# Patient Record
Sex: Female | Born: 1960
Health system: Southern US, Community
[De-identification: ages and names within clinical notes are randomized; demographics above are authoritative.]

## PROBLEM LIST (undated history)

## (undated) DIAGNOSIS — B029 Zoster without complications: Secondary | ICD-10-CM

## (undated) DIAGNOSIS — K2270 Barrett's esophagus without dysplasia: Secondary | ICD-10-CM

## (undated) DIAGNOSIS — K209 Esophagitis, unspecified without bleeding: Secondary | ICD-10-CM

## (undated) DIAGNOSIS — U071 COVID-19: Secondary | ICD-10-CM

## (undated) DIAGNOSIS — K219 Gastro-esophageal reflux disease without esophagitis: Secondary | ICD-10-CM

## (undated) DIAGNOSIS — M1990 Unspecified osteoarthritis, unspecified site: Secondary | ICD-10-CM

## (undated) DIAGNOSIS — G5600 Carpal tunnel syndrome, unspecified upper limb: Secondary | ICD-10-CM

## (undated) DIAGNOSIS — E785 Hyperlipidemia, unspecified: Secondary | ICD-10-CM

## (undated) HISTORY — DX: Hyperlipidemia, unspecified: E78.5

## (undated) HISTORY — PX: VEIN SURGERY: SHX48

## (undated) HISTORY — DX: Esophagitis, unspecified without bleeding: K20.90

## (undated) HISTORY — PX: OTHER SURGICAL HISTORY: SHX169

## (undated) HISTORY — DX: Zoster without complications: B02.9

## (undated) HISTORY — DX: Barrett's esophagus without dysplasia: K22.70

## (undated) HISTORY — DX: Esophagitis, unspecified: K20.9

## (undated) HISTORY — DX: Unspecified osteoarthritis, unspecified site: M19.90

## (undated) HISTORY — DX: COVID-19: U07.1

## (undated) HISTORY — DX: Carpal tunnel syndrome, unspecified upper limb: G56.00

## (undated) HISTORY — PX: NOVASURE ABLATION: SHX5394

## (undated) HISTORY — DX: Gastro-esophageal reflux disease without esophagitis: K21.9

---

## 1998-11-24 HISTORY — PX: TUBAL LIGATION: SHX77

## 2009-02-09 ENCOUNTER — Ambulatory Visit: Payer: Self-pay | Admitting: General Practice

## 2010-03-12 ENCOUNTER — Other Ambulatory Visit: Payer: Self-pay | Admitting: Obstetrics and Gynecology

## 2010-04-23 ENCOUNTER — Ambulatory Visit: Payer: Self-pay | Admitting: Obstetrics and Gynecology

## 2010-07-31 ENCOUNTER — Other Ambulatory Visit: Payer: Self-pay

## 2011-03-14 ENCOUNTER — Other Ambulatory Visit: Payer: Self-pay | Admitting: Obstetrics and Gynecology

## 2011-05-01 ENCOUNTER — Ambulatory Visit: Payer: Self-pay | Admitting: Obstetrics and Gynecology

## 2011-08-02 ENCOUNTER — Other Ambulatory Visit: Payer: Self-pay

## 2011-08-20 ENCOUNTER — Ambulatory Visit: Payer: Self-pay | Admitting: Unknown Physician Specialty

## 2011-08-22 LAB — PATHOLOGY REPORT

## 2012-05-15 ENCOUNTER — Other Ambulatory Visit: Payer: Self-pay

## 2012-05-15 LAB — CBC WITH DIFFERENTIAL/PLATELET
Basophil #: 0 10*3/uL (ref 0.0–0.1)
Basophil %: 0.5 %
Eosinophil %: 1.5 %
HCT: 42.2 % (ref 35.0–47.0)
HGB: 14.4 g/dL (ref 12.0–16.0)
Lymphocyte #: 2 10*3/uL (ref 1.0–3.6)
Lymphocyte %: 28.5 %
MCH: 30.9 pg (ref 26.0–34.0)
Monocyte #: 0.4 x10 3/mm (ref 0.2–0.9)
Neutrophil #: 4.6 10*3/uL (ref 1.4–6.5)
Neutrophil %: 63.9 %
Platelet: 178 10*3/uL (ref 150–440)
RBC: 4.67 10*6/uL (ref 3.80–5.20)
RDW: 13.3 % (ref 11.5–14.5)
WBC: 7.2 10*3/uL (ref 3.6–11.0)

## 2012-05-15 LAB — TSH: Thyroid Stimulating Horm: 2.19 u[IU]/mL

## 2012-05-15 LAB — COMPREHENSIVE METABOLIC PANEL
BUN: 15 mg/dL (ref 7–18)
Bilirubin,Total: 0.4 mg/dL (ref 0.2–1.0)
Calcium, Total: 9 mg/dL (ref 8.5–10.1)
Co2: 25 mmol/L (ref 21–32)
Creatinine: 0.97 mg/dL (ref 0.60–1.30)
EGFR (African American): >60
Glucose: 89 mg/dL (ref 65–99)
Osmolality: 289 (ref 275–301)
Potassium: 4.1 mmol/L (ref 3.5–5.1)
SGOT(AST): 13 U/L — ABNORMAL LOW (ref 15–37)
SGPT (ALT): 24 U/L

## 2012-05-15 LAB — LIPID PANEL
HDL Cholesterol: 41 mg/dL (ref 40–60)
Triglycerides: 127 mg/dL (ref 0–200)
VLDL Cholesterol, Calc: 25 mg/dL (ref 5–40)

## 2012-06-01 ENCOUNTER — Ambulatory Visit: Payer: Self-pay | Admitting: Obstetrics and Gynecology

## 2013-07-25 HISTORY — PX: COLONOSCOPY: SHX174

## 2013-08-23 LAB — HM COLONOSCOPY

## 2014-01-05 ENCOUNTER — Other Ambulatory Visit: Payer: Self-pay | Admitting: Obstetrics and Gynecology

## 2014-01-05 LAB — COMPREHENSIVE METABOLIC PANEL
ALBUMIN: 3.9 g/dL (ref 3.4–5.0)
ANION GAP: 6 — AB (ref 7–16)
AST: 12 U/L — AB (ref 15–37)
Alkaline Phosphatase: 34 U/L — ABNORMAL LOW
BUN: 12 mg/dL (ref 7–18)
Bilirubin,Total: 0.5 mg/dL (ref 0.2–1.0)
CHLORIDE: 107 mmol/L (ref 98–107)
CREATININE: 0.86 mg/dL (ref 0.60–1.30)
Calcium, Total: 8.9 mg/dL (ref 8.5–10.1)
Co2: 25 mmol/L (ref 21–32)
Glucose: 98 mg/dL (ref 65–99)
Osmolality: 275 (ref 275–301)
Potassium: 3.9 mmol/L (ref 3.5–5.1)
SGPT (ALT): 25 U/L (ref 12–78)
SODIUM: 138 mmol/L (ref 136–145)
Total Protein: 7.3 g/dL (ref 6.4–8.2)

## 2014-01-05 LAB — CBC WITH DIFFERENTIAL/PLATELET
BASOS ABS: 0.1 10*3/uL (ref 0.0–0.1)
BASOS PCT: 1 %
EOS ABS: 0.1 10*3/uL (ref 0.0–0.7)
Eosinophil %: 2.1 %
HCT: 41 % (ref 35.0–47.0)
HGB: 13.8 g/dL (ref 12.0–16.0)
LYMPHS ABS: 1.9 10*3/uL (ref 1.0–3.6)
LYMPHS PCT: 33.9 %
MCH: 30.9 pg (ref 26.0–34.0)
MCHC: 33.7 g/dL (ref 32.0–36.0)
MCV: 92 fL (ref 80–100)
MONOS PCT: 7.4 %
Monocyte #: 0.4 x10 3/mm (ref 0.2–0.9)
NEUTROS PCT: 55.6 %
Neutrophil #: 3.1 10*3/uL (ref 1.4–6.5)
Platelet: 176 10*3/uL (ref 150–440)
RBC: 4.48 10*6/uL (ref 3.80–5.20)
RDW: 13.9 % (ref 11.5–14.5)
WBC: 5.5 10*3/uL (ref 3.6–11.0)

## 2014-01-05 LAB — LIPID PANEL
CHOLESTEROL: 159 mg/dL (ref 0–200)
HDL Cholesterol: 51 mg/dL (ref 40–60)
LDL CHOLESTEROL, CALC: 89 mg/dL (ref 0–100)
TRIGLYCERIDES: 94 mg/dL (ref 0–200)
VLDL CHOLESTEROL, CALC: 19 mg/dL (ref 5–40)

## 2014-01-05 LAB — TSH: Thyroid Stimulating Horm: 2.27 u[IU]/mL

## 2014-04-10 ENCOUNTER — Encounter: Payer: Self-pay | Admitting: *Deleted

## 2014-05-01 ENCOUNTER — Ambulatory Visit (INDEPENDENT_AMBULATORY_CARE_PROVIDER_SITE_OTHER): Payer: 59 | Admitting: General Surgery

## 2014-05-01 ENCOUNTER — Encounter: Payer: Self-pay | Admitting: General Surgery

## 2014-05-01 VITALS — BP 102/72 | HR 80 | Resp 14 | Ht 69.0 in | Wt 177.0 lb

## 2014-05-01 DIAGNOSIS — L723 Sebaceous cyst: Secondary | ICD-10-CM

## 2014-05-01 DIAGNOSIS — R229 Localized swelling, mass and lump, unspecified: Secondary | ICD-10-CM

## 2014-05-01 DIAGNOSIS — R222 Localized swelling, mass and lump, trunk: Secondary | ICD-10-CM

## 2014-05-01 NOTE — Patient Instructions (Signed)
1 week nurse  

## 2014-05-01 NOTE — Progress Notes (Signed)
Patient ID: Sarah Dennis, female   DOB: [birth year], 53 y.o.   MRN: [id number]  Chief Complaint  Patient presents with  . Other    cyst on back    HPI Sarah Dennis is a 53 y.o. female here today for an evaluation of an cyst on back. Patient states this cyst has been there for about two years. Patient states the area has been under the skin and in the last two months the area is raised. She states there was moles removed about 15 years ago in that same area.   HPI  Past Medical History  Diagnosis Date  . GERD (gastroesophageal reflux disease)   . Esophagitis     Past Surgical History  Procedure Laterality Date  . Tubal ligation  2000  . Vein surgery  2007,2012  . Cesarean section  1988  . Colonoscopy  07/2013    Dr. Mechele Collin  . Mole removal  15 yaers ago    No family history on file.  Social History History  Substance Use Topics  . Smoking status: Never Smoker   . Smokeless tobacco: Never Used  . Alcohol Use: Yes    Allergies  Allergen Reactions  . Percocet [Oxycodone-Acetaminophen] Itching    Current Outpatient Prescriptions  Medication Sig Dispense Refill  . aspirin 81 MG tablet Take 81 mg by mouth daily.      Marland Kitchen doxylamine, Sleep, (UNISOM) 25 MG tablet Take 25 mg by mouth at bedtime.      . Fish Oil-Cholecalciferol (FISH OIL + D3) 1000-1000 MG-UNIT CAPS Take 1 capsule by mouth daily.      Marland Kitchen glucosamine-chondroitin 500-400 MG tablet Take 1 tablet by mouth 3 (three) times daily.      . metoCLOPramide (REGLAN) 10 MG tablet Take 10 mg by mouth every 6 (six) hours as needed for nausea.      . Multiple Vitamins-Minerals (MULTIVITAMIN WITH MINERALS) tablet Take 1 tablet by mouth daily.      . norethindrone-ethinyl estradiol (MICROGESTIN,JUNEL,LOESTRIN) 1-20 MG-MCG tablet Take 1 tablet by mouth daily.      . pantoprazole (PROTONIX) 40 MG tablet Take 40 mg by mouth daily.       No current facility-administered medications for this visit.    Review of Systems Review of  Systems  Constitutional: Negative.   Respiratory: Negative.   Cardiovascular: Negative.     Blood pressure 102/72, pulse 80, resp. rate 14, height 5\' 9"  (1.753 m), weight 177 lb (80.287 kg).  Physical Exam Physical Exam  Constitutional: She is oriented to person, place, and time. She appears well-developed and well-nourished.  Eyes: Conjunctivae are normal. No scleral icterus.  Neck: Neck supple.  Cardiovascular: Normal rate, regular rhythm and normal heart sounds.   Pulmonary/Chest: Effort normal and breath sounds normal.  Neurological: She is alert and oriented to person, place, and time.  Skin: Skin is warm and dry.             Assessment    Sebaceous cyst of the right back and midline skin.     Plan    The areas of focal ureteric from her undergarments. It was elected to resect these lesions. A total of 20 cc of 0.5% Xylocaine with 0.25% Marcaine with 1-200,000 units of epinephrine was utilized to well-tolerated. The skin was prepped with ChloraPrep draped. A midline lesion was approached first. This was excised elliptical incision. The intact cyst was excised. Skin defect was closed with interrupted 4-0 nylon sutures. Telfa and Tegaderm dressing  applied.  Attention was turned to the right upper back lesion. This was prepped with ChloraPrep and excised elliptical incision. The cyst wall was removed in its entirety. The skin defect was closed with interrupted 4-0 nylon sutures. Telfa Tegaderm dressing applied.  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 As she is traveling to [facility] tomorrow to see her grandchild a prescription for Norco 5/325, #30 with description 1-2 p.o. Q.4 h. P.r.n. For pain was provided. She returned one week for suture removal to staff.        Merrily Pew Everton Bertha 05/01/2014, 9:05 PM

## 2014-05-02 LAB — PATHOLOGY

## 2014-05-03 ENCOUNTER — Telehealth: Payer: Self-pay | Admitting: *Deleted

## 2014-05-03 NOTE — Telephone Encounter (Signed)
Message copied by Carson Myrtle on Wed May 03, 2014  8:10 AM ------      Message from: Robert Bellow      Created: Tue May 02, 2014  7:51 PM       Please notify the patient both lesions were benign. She is traveling, so may be best route to notify her..      ----- Message -----         From: [facility] Lab Results In Interface         Sent: 05/02/2014   4:42 PM           To: Robert Bellow, MD                   ------

## 2014-05-03 NOTE — Telephone Encounter (Signed)
Notified patient as instructed, patient pleased. Discussed follow-up appointments, patient agrees  

## 2014-05-08 ENCOUNTER — Ambulatory Visit (INDEPENDENT_AMBULATORY_CARE_PROVIDER_SITE_OTHER): Payer: 59 | Admitting: *Deleted

## 2014-05-08 DIAGNOSIS — R222 Localized swelling, mass and lump, trunk: Secondary | ICD-10-CM

## 2014-05-08 DIAGNOSIS — R229 Localized swelling, mass and lump, unspecified: Secondary | ICD-10-CM

## 2014-05-08 NOTE — Progress Notes (Signed)
Patient came in today for a wound check back cyst excision .  The wound is clean, with no signs of infection noted. The sutures were removed and steri strips applied.

## 2014-09-25 ENCOUNTER — Encounter: Payer: Self-pay | Admitting: General Surgery

## 2015-02-15 ENCOUNTER — Encounter: Payer: Self-pay | Admitting: General Surgery

## 2015-02-15 ENCOUNTER — Ambulatory Visit (INDEPENDENT_AMBULATORY_CARE_PROVIDER_SITE_OTHER): Payer: 59 | Admitting: General Surgery

## 2015-02-15 VITALS — BP 110/78 | HR 80 | Resp 12 | Ht 69.0 in | Wt 182.0 lb

## 2015-02-15 DIAGNOSIS — L989 Disorder of the skin and subcutaneous tissue, unspecified: Secondary | ICD-10-CM

## 2015-02-15 DIAGNOSIS — R2241 Localized swelling, mass and lump, right lower limb: Secondary | ICD-10-CM

## 2015-02-15 NOTE — Progress Notes (Signed)
Patient ID: Sarah Dennis, female   DOB: 09-18-1961, 54 y.o.   MRN: [id number]  Chief Complaint  Patient presents with  . Procedure    HPI Sarah Dennis is a 54 y.o. female.  Here today for excision of a mass posterior neck and upper right thigh. Both areas are frequently traumatized by closing or jewelry. No new complaints.  HPI  Past Medical History  Diagnosis Date  . GERD (gastroesophageal reflux disease)   . Esophagitis     Past Surgical History  Procedure Laterality Date  . Tubal ligation  2000  . Vein surgery  2007,2012  . Cesarean section  1988  . Colonoscopy  07/2013    Dr. Vira Agar  . Mole removal  15 yaers ago    No family history on file.  Social History History  Substance Use Topics  . Smoking status: Never Smoker   . Smokeless tobacco: Never Used  . Alcohol Use: Yes    Allergies  Allergen Reactions  . Percocet [Oxycodone-Acetaminophen] Itching    Current Outpatient Prescriptions  Medication Sig Dispense Refill  . aspirin 81 MG tablet Take 81 mg by mouth daily.    Marland Kitchen doxylamine, Sleep, (UNISOM) 25 MG tablet Take 25 mg by mouth at bedtime.    . Fish Oil-Cholecalciferol (FISH OIL + D3) 1000-1000 MG-UNIT CAPS Take 1 capsule by mouth daily.    Marland Kitchen glucosamine-chondroitin 500-400 MG tablet Take 1 tablet by mouth 3 (three) times daily.    . metoCLOPramide (REGLAN) 10 MG tablet Take 10 mg by mouth every 6 (six) hours as needed for nausea.    . Multiple Vitamins-Minerals (MULTIVITAMIN WITH MINERALS) tablet Take 1 tablet by mouth daily.    . norethindrone-ethinyl estradiol (MICROGESTIN,JUNEL,LOESTRIN) 1-20 MG-MCG tablet Take 1 tablet by mouth daily.    . pantoprazole (PROTONIX) 40 MG tablet Take 40 mg by mouth daily.     No current facility-administered medications for this visit.    Review of Systems Review of Systems  Constitutional: Negative.   Respiratory: Negative.   Cardiovascular: Negative.     Blood pressure 110/78, pulse 80, resp. rate 12, height 5'  9" (1.753 m), weight 182 lb (82.555 kg).  Physical Exam Physical Exam  Neck:    Abdominal:         Assessment    Symptomatic skin nodules.    Plan    The area on the thigh was treated with 10 mL of 0.5% Xylocaine with 0.25% Marcaine with 1-200,000 of epinephrine. The neck lesion received 6 mL of a similar mixture.  Both sites were prepped with ChloraPrep and draped. Each were excised through elliptical incisions. Skin lesion on the was completed first. The skin defect was closed with interrupted 5-0 nylon sutures. The proximal thigh lesion on the right was treated in a similar fashion. Both wounds were covered with Telfa and Tegaderm dressings. Both lesions were sent for routine histology.  The patient already the procedure well. She'll make use of Tylenol if needed for pain. She will follow up in one week for suture removal and wound evaluation with the staff.     PCP:  No Pcp Per Patient  Sarah Dennis 02/16/2015, 6:30 AM

## 2015-02-15 NOTE — Patient Instructions (Signed)
Keep area clean 

## 2015-02-16 DIAGNOSIS — L989 Disorder of the skin and subcutaneous tissue, unspecified: Secondary | ICD-10-CM | POA: Insufficient documentation

## 2015-02-16 DIAGNOSIS — R2240 Localized swelling, mass and lump, unspecified lower limb: Secondary | ICD-10-CM

## 2015-02-16 HISTORY — DX: Localized swelling, mass and lump, unspecified lower limb: R22.40

## 2015-02-16 HISTORY — DX: Disorder of the skin and subcutaneous tissue, unspecified: L98.9

## 2015-02-22 ENCOUNTER — Ambulatory Visit (INDEPENDENT_AMBULATORY_CARE_PROVIDER_SITE_OTHER): Payer: 59 | Admitting: *Deleted

## 2015-02-22 ENCOUNTER — Ambulatory Visit: Payer: 59

## 2015-02-22 DIAGNOSIS — L723 Sebaceous cyst: Secondary | ICD-10-CM

## 2015-02-22 NOTE — Progress Notes (Signed)
Patient came in today for a wound check, suture removal.  The wound is clean, with no signs of infection noted. Follow up as needed.

## 2015-02-22 NOTE — Patient Instructions (Signed)
The patient is aware to call back for any questions or concerns.  

## 2015-07-26 ENCOUNTER — Other Ambulatory Visit
Admission: RE | Admit: 2015-07-26 | Discharge: 2015-07-26 | Disposition: A | Discharge status: Home / Self Care | Payer: 59 | Source: Ambulatory Visit | Attending: Obstetrics and Gynecology | Admitting: Obstetrics and Gynecology

## 2015-07-26 DIAGNOSIS — N951 Menopausal and female climacteric states: Secondary | ICD-10-CM | POA: Insufficient documentation

## 2015-07-26 LAB — COMPREHENSIVE METABOLIC PANEL
ALK PHOS: 38 U/L (ref 38–126)
ALT: 31 U/L (ref 14–54)
AST: 28 U/L (ref 15–41)
Albumin: 4.2 g/dL (ref 3.5–5.0)
Anion gap: 9 (ref 5–15)
BILIRUBIN TOTAL: 0.3 mg/dL (ref 0.3–1.2)
BUN: 14 mg/dL (ref 6–20)
CO2: 24 mmol/L (ref 22–32)
Calcium: 9 mg/dL (ref 8.9–10.3)
Chloride: 107 mmol/L (ref 101–111)
Creatinine, Ser: 0.81 mg/dL (ref 0.44–1.00)
GLUCOSE: 91 mg/dL (ref 65–99)
Potassium: 4 mmol/L (ref 3.5–5.1)
Sodium: 140 mmol/L (ref 135–145)
Total Protein: 7.3 g/dL (ref 6.5–8.1)

## 2015-07-26 LAB — LIPID PANEL
CHOL/HDL RATIO: 4 ratio
Cholesterol: 207 mg/dL — ABNORMAL HIGH (ref 0–200)
HDL: 52 mg/dL (ref >40)
LDL CALC: 139 mg/dL — AB (ref 0–99)
TRIGLYCERIDES: 78 mg/dL (ref <150)
VLDL: 16 mg/dL (ref 0–40)

## 2015-07-26 LAB — CBC WITH DIFFERENTIAL/PLATELET
Basophils Absolute: 0.1 10*3/uL (ref 0–0.1)
Basophils Relative: 1 %
Eosinophils Absolute: 0.2 10*3/uL (ref 0–0.7)
Eosinophils Relative: 3 %
HEMATOCRIT: 42.4 % (ref 35.0–47.0)
HEMOGLOBIN: 14.3 g/dL (ref 12.0–16.0)
LYMPHS ABS: 2.3 10*3/uL (ref 1.0–3.6)
Lymphocytes Relative: 38 %
MCH: 30.7 pg (ref 26.0–34.0)
MCHC: 33.8 g/dL (ref 32.0–36.0)
MCV: 90.7 fL (ref 80.0–100.0)
MONOS PCT: 8 %
Monocytes Absolute: 0.5 10*3/uL (ref 0.2–0.9)
NEUTROS ABS: 3.1 10*3/uL (ref 1.4–6.5)
NEUTROS PCT: 50 %
Platelets: 172 10*3/uL (ref 150–440)
RBC: 4.67 MIL/uL (ref 3.80–5.20)
RDW: 13.1 % (ref 11.5–14.5)
WBC: 6.2 10*3/uL (ref 3.6–11.0)

## 2015-07-26 LAB — TSH: TSH: 2.747 u[IU]/mL (ref 0.350–4.500)

## 2015-07-27 LAB — VITAMIN D 25 HYDROXY (VIT D DEFICIENCY, FRACTURES): VIT D 25 HYDROXY: 49.9 ng/mL (ref 30.0–100.0)

## 2015-07-27 LAB — FOLLICLE STIMULATING HORMONE: FSH: 30.2 m[IU]/mL

## 2015-07-31 LAB — MISC LABCORP TEST (SEND OUT): LABCORP TEST CODE: 500649

## 2015-08-23 ENCOUNTER — Other Ambulatory Visit
Admission: RE | Admit: 2015-08-23 | Discharge: 2015-08-23 | Disposition: A | Discharge status: Home / Self Care | Payer: 59 | Source: Ambulatory Visit | Attending: Obstetrics and Gynecology | Admitting: Obstetrics and Gynecology

## 2015-08-23 DIAGNOSIS — N951 Menopausal and female climacteric states: Secondary | ICD-10-CM | POA: Insufficient documentation

## 2015-08-24 LAB — FOLLICLE STIMULATING HORMONE: FSH: 52.6 m[IU]/mL

## 2015-08-30 LAB — MISC LABCORP TEST (SEND OUT): LABCORP TEST CODE: 500649

## 2015-12-13 DIAGNOSIS — G64 Other disorders of peripheral nervous system: Secondary | ICD-10-CM | POA: Diagnosis not present

## 2015-12-31 ENCOUNTER — Encounter: Payer: 59 | Admitting: Neurology

## 2016-01-08 ENCOUNTER — Encounter: Payer: Self-pay | Admitting: Neurology

## 2016-01-08 ENCOUNTER — Ambulatory Visit (INDEPENDENT_AMBULATORY_CARE_PROVIDER_SITE_OTHER): Payer: 59 | Admitting: Neurology

## 2016-01-08 ENCOUNTER — Ambulatory Visit (INDEPENDENT_AMBULATORY_CARE_PROVIDER_SITE_OTHER): Payer: Self-pay | Admitting: Neurology

## 2016-01-08 DIAGNOSIS — G5601 Carpal tunnel syndrome, right upper limb: Secondary | ICD-10-CM | POA: Diagnosis not present

## 2016-01-08 DIAGNOSIS — G5600 Carpal tunnel syndrome, unspecified upper limb: Secondary | ICD-10-CM

## 2016-01-08 HISTORY — DX: Carpal tunnel syndrome, unspecified upper limb: G56.00

## 2016-01-08 NOTE — Procedures (Signed)
     HISTORY:  Sarah Dennis is a 55 year old patient with a one-month history of some numbness involving the right hand, worse in the evening hours. She reports some mild weakness with grip. She denies any neck pain or pain down the right arm. There are no symptoms involving the left hand. She is being evaluated for possible carpal tunnel syndrome.  NERVE CONDUCTION STUDIES:  Nerve conduction studies were performed on the right upper extremity. The distal motor latencies and motor amplitudes for the ulnar nerves was within normal limits. The distal motor latency for the right median nerve was prolonged, with a normal motor amplitude. The F wave latencies and nerve conduction velocities for the median and ulnar nerves were normal. The sensory latencies for the ulnar nerve was normal, but was prolonged for the right median nerve.   EMG STUDIES:  EMG study was performed on the right upper extremity:  The first dorsal interosseous muscle reveals 2 to 4 K units with full recruitment. No fibrillations or positive waves were noted. The abductor pollicis brevis muscle reveals 2 to 4 K units with full recruitment. No fibrillations or positive waves were noted. The extensor indicis proprius muscle reveals 1 to 3 K units with full recruitment. No fibrillations or positive waves were noted. The pronator teres muscle reveals 2 to 3 K units with full recruitment. No fibrillations or positive waves were noted. The biceps muscle reveals 1 to 2 K units with full recruitment. No fibrillations or positive waves were noted. The triceps muscle reveals 2 to 4 K units with full recruitment. No fibrillations or positive waves were noted. The anterior deltoid muscle reveals 2 to 3 K units with full recruitment. No fibrillations or positive waves were noted. The cervical paraspinal muscles were tested at 2 levels. No abnormalities of insertional activity were seen at either level tested. There was good  relaxation.   IMPRESSION:  Nerve conduction studies done on the right upper extremity shows evidence of a mild right carpal tunnel syndrome. EMG evaluation of the right upper extremity was unremarkable, without evidence of an overlying cervical radiculopathy.  Jill Alexanders MD 01/08/2016 4:44 PM  [facility] [address] [place], [place] [address]  Phone [phone] Fax [phone]

## 2016-01-08 NOTE — Progress Notes (Signed)
Please refer to EMG and nerve conduction study procedure note. 

## 2016-01-25 ENCOUNTER — Encounter: Payer: Self-pay | Admitting: Neurology

## 2016-01-25 NOTE — Progress Notes (Signed)
Please refer to EMG and nerve conduction study procedure note. 

## 2016-01-25 NOTE — Addendum Note (Signed)
Addended by: Margette Fast on: 01/25/2016 07:47 AM   Modules accepted: Level of Service

## 2016-02-28 DIAGNOSIS — G5601 Carpal tunnel syndrome, right upper limb: Secondary | ICD-10-CM | POA: Diagnosis not present

## 2016-05-02 ENCOUNTER — Encounter: Payer: Self-pay | Admitting: Physician Assistant

## 2016-05-02 ENCOUNTER — Ambulatory Visit: Payer: Self-pay | Admitting: Physician Assistant

## 2016-05-02 VITALS — BP 112/80 | HR 64 | Temp 98.6°F

## 2016-05-02 DIAGNOSIS — H00026 Hordeolum internum left eye, unspecified eyelid: Secondary | ICD-10-CM

## 2016-05-02 MED ORDER — OLOPATADINE HCL 0.1 % OP SOLN: 1.0000 [drp] | Freq: Two times a day (BID) | OPHTHALMIC | Status: DC

## 2016-05-02 MED ORDER — GENTAMICIN SULFATE 0.3 % OP SOLN: 1.0000 [drp] | OPHTHALMIC | Status: DC

## 2016-05-02 NOTE — Progress Notes (Signed)
   Subjective:left eye pain    Patient ID: Sarah Dennis, female    DOB: February 24, 1961, 55 y.o.   MRN: [id number]  HPI Patient c/o left eye pain 2nd to papular lesion internal inner eye lid. Denies vision problems. Onset yesterdays. Patient applying warm compresses to area.  Review of Systems    Gastric Refluc Objective:   Physical Exam No acute distress. PERRL/EOM intact. Erythematous papular lesion left inner lower eyelid.       Assessment & Plan:Sty left eye  Continue warm compresses and instill eye drops as directed.  Follow up 3 days if no improvement.

## 2016-07-02 DIAGNOSIS — Z124 Encounter for screening for malignant neoplasm of cervix: Secondary | ICD-10-CM | POA: Diagnosis not present

## 2016-07-02 DIAGNOSIS — Z01419 Encounter for gynecological examination (general) (routine) without abnormal findings: Secondary | ICD-10-CM | POA: Diagnosis not present

## 2016-07-28 ENCOUNTER — Other Ambulatory Visit
Admission: RE | Admit: 2016-07-28 | Discharge: 2016-07-28 | Disposition: A | Discharge status: Home / Self Care | Payer: 59 | Source: Ambulatory Visit | Attending: Obstetrics and Gynecology | Admitting: Obstetrics and Gynecology

## 2016-07-28 DIAGNOSIS — Z1321 Encounter for screening for nutritional disorder: Secondary | ICD-10-CM | POA: Insufficient documentation

## 2016-07-28 DIAGNOSIS — Z13220 Encounter for screening for lipoid disorders: Secondary | ICD-10-CM | POA: Insufficient documentation

## 2016-07-28 DIAGNOSIS — Z131 Encounter for screening for diabetes mellitus: Secondary | ICD-10-CM | POA: Diagnosis not present

## 2016-07-28 DIAGNOSIS — Z136 Encounter for screening for cardiovascular disorders: Secondary | ICD-10-CM | POA: Insufficient documentation

## 2016-07-28 LAB — CBC WITH DIFFERENTIAL/PLATELET
BASOS ABS: 0 10*3/uL (ref 0–0.1)
BASOS PCT: 1 %
EOS ABS: 0.1 10*3/uL (ref 0–0.7)
Eosinophils Relative: 3 %
HCT: 41.6 % (ref 35.0–47.0)
HEMOGLOBIN: 14.6 g/dL (ref 12.0–16.0)
Lymphocytes Relative: 27 %
Lymphs Abs: 1.5 10*3/uL (ref 1.0–3.6)
MCH: 30.8 pg (ref 26.0–34.0)
MCHC: 35.1 g/dL (ref 32.0–36.0)
MCV: 87.8 fL (ref 80.0–100.0)
MONOS PCT: 8 %
Monocytes Absolute: 0.5 10*3/uL (ref 0.2–0.9)
NEUTROS ABS: 3.5 10*3/uL (ref 1.4–6.5)
NEUTROS PCT: 61 %
Platelets: 151 10*3/uL (ref 150–440)
RBC: 4.73 MIL/uL (ref 3.80–5.20)
RDW: 13.9 % (ref 11.5–14.5)
WBC: 5.7 10*3/uL (ref 3.6–11.0)

## 2016-07-28 LAB — LIPID PANEL
CHOL/HDL RATIO: 3.8 ratio
Cholesterol: 251 mg/dL — ABNORMAL HIGH (ref 0–200)
HDL: 66 mg/dL (ref >40)
LDL CALC: 174 mg/dL — AB (ref 0–99)
Triglycerides: 57 mg/dL (ref <150)
VLDL: 11 mg/dL (ref 0–40)

## 2016-07-28 LAB — COMPREHENSIVE METABOLIC PANEL
ALBUMIN: 4.6 g/dL (ref 3.5–5.0)
ALT: 18 U/L (ref 14–54)
AST: 16 U/L (ref 15–41)
Alkaline Phosphatase: 67 U/L (ref 38–126)
Anion gap: 9 (ref 5–15)
BUN: 28 mg/dL — AB (ref 6–20)
CHLORIDE: 106 mmol/L (ref 101–111)
CO2: 25 mmol/L (ref 22–32)
CREATININE: 0.82 mg/dL (ref 0.44–1.00)
Calcium: 9.3 mg/dL (ref 8.9–10.3)
GFR calc Af Amer: >60 mL/min (ref >60)
GFR calc non Af Amer: >60 mL/min (ref >60)
GLUCOSE: 108 mg/dL — AB (ref 65–99)
POTASSIUM: 4.2 mmol/L (ref 3.5–5.1)
Sodium: 140 mmol/L (ref 135–145)
Total Bilirubin: 0.7 mg/dL (ref 0.3–1.2)
Total Protein: 7.6 g/dL (ref 6.5–8.1)

## 2016-07-28 LAB — TSH: TSH: 2.199 u[IU]/mL (ref 0.350–4.500)

## 2016-07-29 LAB — VITAMIN D 25 HYDROXY (VIT D DEFICIENCY, FRACTURES): Vit D, 25-Hydroxy: 41.7 ng/mL (ref 30.0–100.0)

## 2016-08-07 DIAGNOSIS — Z1231 Encounter for screening mammogram for malignant neoplasm of breast: Secondary | ICD-10-CM | POA: Diagnosis not present

## 2016-10-07 DIAGNOSIS — H40003 Preglaucoma, unspecified, bilateral: Secondary | ICD-10-CM | POA: Diagnosis not present

## 2016-10-07 DIAGNOSIS — H5213 Myopia, bilateral: Secondary | ICD-10-CM | POA: Diagnosis not present

## 2016-10-29 ENCOUNTER — Telehealth: Payer: 59 | Admitting: Family

## 2016-10-29 DIAGNOSIS — J329 Chronic sinusitis, unspecified: Secondary | ICD-10-CM | POA: Diagnosis not present

## 2016-10-29 DIAGNOSIS — B9789 Other viral agents as the cause of diseases classified elsewhere: Secondary | ICD-10-CM | POA: Diagnosis not present

## 2016-10-29 MED ORDER — FLUTICASONE PROPIONATE 50 MCG/ACT NA SUSP: 2.0000 | Freq: Every day | NASAL | 6 refills | Status: DC

## 2016-10-29 NOTE — Progress Notes (Signed)

## 2016-12-03 ENCOUNTER — Telehealth: Payer: 59 | Admitting: Nurse Practitioner

## 2016-12-03 DIAGNOSIS — R05 Cough: Secondary | ICD-10-CM

## 2016-12-03 DIAGNOSIS — R059 Cough, unspecified: Secondary | ICD-10-CM

## 2016-12-03 MED ORDER — BENZONATATE 100 MG PO CAPS: 100.0000 mg | ORAL_CAPSULE | Freq: Three times a day (TID) | ORAL | 0 refills | Status: DC | PRN

## 2016-12-03 MED ORDER — AZITHROMYCIN 250 MG PO TABS: ORAL_TABLET | ORAL | 0 refills | Status: DC

## 2016-12-03 NOTE — Progress Notes (Signed)

## 2017-03-17 ENCOUNTER — Ambulatory Visit: Payer: Self-pay | Admitting: Physician Assistant

## 2017-03-17 ENCOUNTER — Encounter: Payer: Self-pay | Admitting: Physician Assistant

## 2017-03-17 VITALS — BP 120/90 | HR 90 | Temp 99.0°F

## 2017-03-17 DIAGNOSIS — B029 Zoster without complications: Secondary | ICD-10-CM

## 2017-03-17 MED ORDER — METHYLPREDNISOLONE 4 MG PO TBPK: ORAL_TABLET | ORAL | 0 refills | Status: DC

## 2017-03-17 MED ORDER — FAMCICLOVIR 500 MG PO TABS: 500.0000 mg | ORAL_TABLET | Freq: Three times a day (TID) | ORAL | 0 refills | Status: DC

## 2017-03-17 NOTE — Progress Notes (Signed)
S: c/o rash on scalp, painful, radiates to ear, hx of shingles 5 years ago and it went into her eye, no known fever/chills, no body aches, no there sx, ros neg  O: vitals wnl, nad, scalp with a few lesions in hair, no drainage, n/v intact, lungs c t a, cv rrr  A: shingles  P: famvir, medrol dose

## 2017-05-12 ENCOUNTER — Encounter: Payer: Self-pay | Admitting: General Surgery

## 2017-05-12 ENCOUNTER — Ambulatory Visit (INDEPENDENT_AMBULATORY_CARE_PROVIDER_SITE_OTHER): Payer: 59 | Admitting: General Surgery

## 2017-05-12 VITALS — BP 108/76 | HR 82 | Resp 12 | Ht 69.0 in | Wt 198.0 lb

## 2017-05-12 DIAGNOSIS — L738 Other specified follicular disorders: Secondary | ICD-10-CM | POA: Diagnosis not present

## 2017-05-12 DIAGNOSIS — L723 Sebaceous cyst: Secondary | ICD-10-CM | POA: Diagnosis not present

## 2017-05-12 DIAGNOSIS — L989 Disorder of the skin and subcutaneous tissue, unspecified: Secondary | ICD-10-CM

## 2017-05-12 NOTE — Patient Instructions (Addendum)
The patient is aware to call back for any questions or concerns. Keep area clean 

## 2017-05-12 NOTE — Progress Notes (Signed)
Patient ID: Sarah Dennis, female   DOB: [birth year], 56 y.o.   MRN: [id number]  Chief Complaint  Patient presents with  . Mass    HPI Sarah Dennis is a 56 y.o. female.  Here today for evaluation of 2 skin moles/tags. One is right side of face near eye and the other is right clavicle are. She states they have been there many years.   HPI  Past Medical History:  Diagnosis Date  . Carpal tunnel syndrome 01/08/2016   Right  . Esophagitis   . GERD (gastroesophageal reflux disease)     Past Surgical History:  Procedure Laterality Date  . CESAREAN SECTION  1988  . COLONOSCOPY  07/2013   Dr. Vira Agar  . mole removal  15 yaers ago  . TUBAL LIGATION  2000  . VEIN SURGERY  2007,2012    No family history on file.  Social History Social History  Substance Use Topics  . Smoking status: Never Smoker  . Smokeless tobacco: Never Used  . Alcohol use Yes    Allergies  Allergen Reactions  . Percocet [Oxycodone-Acetaminophen] Itching    Current Outpatient Prescriptions  Medication Sig Dispense Refill  . doxylamine, Sleep, (UNISOM) 25 MG tablet Take 25 mg by mouth at bedtime.    Marland Kitchen glucosamine-chondroitin 500-400 MG tablet Take 1 tablet by mouth 3 (three) times daily.    . metoCLOPramide (REGLAN) 10 MG tablet Take 10 mg by mouth every 6 (six) hours as needed for nausea.    . pantoprazole (PROTONIX) 40 MG tablet Take 40 mg by mouth daily.    . progesterone (PROMETRIUM) 200 MG capsule   3   No current facility-administered medications for this visit.     Review of Systems Review of Systems  Constitutional: Negative.   Respiratory: Negative.   Cardiovascular: Negative.     Blood pressure 108/76, pulse 82, resp. rate 12, height 5\' 9"  (1.753 m), weight 198 lb (89.8 kg).  Physical Exam Physical Exam  Constitutional: She is oriented to person, place, and time. She appears well-developed and well-nourished.  HENT:  Head:    Neurological: She is alert and oriented to person, place,  and time.  Skin: Skin is warm and dry.  Psychiatric: Her behavior is normal.    Assessment    Sebaceous cyst of face Symptomatic skin tag right neck.    Plan    Excision options reviewed. Skin cleansed with alcohol. 2 mL of 0.5% Xylocaine with 0.25% Marcaine with 1 200,000 epinephrine utilized. The skin cyst was removed with a 2.5 mm punch biopsy. Skin defect approximated in line with the associated skin crease making use of a 5-0 Prolene suture. Telfa and Tegaderm dressing applied. Specimen sent for routine histology.   The skin tag was removed with thermal cautery and discarded. Bacitracin and a Band-Aid applied.  Return for suture removal 6-7 days based on work schedule.. Will call with results  HPI, Physical Exam, Assessment and Plan have been scribed under the direction and in the presence of Robert Bellow, MD.  Sarah Fetch, RN  I have completed the exam and reviewed the above documentation for accuracy and completeness.  I agree with the above.  Haematologist has been used and any errors in dictation or transcription are unintentional.  Hervey Ard, M.D., F.A.C.S.  Robert Bellow 05/13/2017, 6:27 AM

## 2017-05-13 ENCOUNTER — Telehealth: Payer: Self-pay

## 2017-05-13 DIAGNOSIS — L989 Disorder of the skin and subcutaneous tissue, unspecified: Secondary | ICD-10-CM | POA: Insufficient documentation

## 2017-05-13 NOTE — Telephone Encounter (Signed)
Notified patient as instructed, patient pleased. Discussed follow-up appointments, patient agrees  

## 2017-05-13 NOTE — Telephone Encounter (Signed)
-----   Message from Robert Bellow, MD sent at 05/13/2017  1:20 PM EDT ----- Please notify the patient that the facial lesion was benign and likely a skin cyst. Suture removal next week as discussed at the time of excision.

## 2017-05-19 ENCOUNTER — Ambulatory Visit (INDEPENDENT_AMBULATORY_CARE_PROVIDER_SITE_OTHER): Payer: 59 | Admitting: *Deleted

## 2017-05-19 DIAGNOSIS — L989 Disorder of the skin and subcutaneous tissue, unspecified: Secondary | ICD-10-CM

## 2017-05-19 NOTE — Patient Instructions (Signed)
The patient is aware to call back for any questions or concerns.  

## 2017-05-19 NOTE — Progress Notes (Signed)
Patient ID: Sarah Dennis, female   DOB: Aug 04, 1961, 56 y.o.   MRN: [id number]  Patient came in today for a wound check/suture removal.  The one suture were removed, band aid applied. The wound is clean, with no signs of infection noted. Aware of pathology. Follow up as needed.

## 2017-07-22 DIAGNOSIS — M659 Synovitis and tenosynovitis, unspecified: Secondary | ICD-10-CM | POA: Diagnosis not present

## 2017-07-22 DIAGNOSIS — M79671 Pain in right foot: Secondary | ICD-10-CM | POA: Diagnosis not present

## 2017-07-22 DIAGNOSIS — M7671 Peroneal tendinitis, right leg: Secondary | ICD-10-CM | POA: Diagnosis not present

## 2017-07-23 DIAGNOSIS — Z124 Encounter for screening for malignant neoplasm of cervix: Secondary | ICD-10-CM | POA: Diagnosis not present

## 2017-07-23 DIAGNOSIS — Z01419 Encounter for gynecological examination (general) (routine) without abnormal findings: Secondary | ICD-10-CM | POA: Diagnosis not present

## 2017-07-23 LAB — HM PAP SMEAR: HM Pap smear: NORMAL

## 2017-08-22 ENCOUNTER — Other Ambulatory Visit
Admission: RE | Admit: 2017-08-22 | Discharge: 2017-08-22 | Disposition: A | Discharge status: Home / Self Care | Payer: 59 | Source: Ambulatory Visit | Attending: Obstetrics and Gynecology | Admitting: Obstetrics and Gynecology

## 2017-08-22 DIAGNOSIS — Z136 Encounter for screening for cardiovascular disorders: Secondary | ICD-10-CM | POA: Diagnosis not present

## 2017-08-22 DIAGNOSIS — Z1321 Encounter for screening for nutritional disorder: Secondary | ICD-10-CM | POA: Diagnosis not present

## 2017-08-22 DIAGNOSIS — Z1329 Encounter for screening for other suspected endocrine disorder: Secondary | ICD-10-CM | POA: Diagnosis not present

## 2017-08-22 DIAGNOSIS — Z13220 Encounter for screening for lipoid disorders: Secondary | ICD-10-CM | POA: Diagnosis not present

## 2017-08-22 DIAGNOSIS — Z131 Encounter for screening for diabetes mellitus: Secondary | ICD-10-CM | POA: Diagnosis not present

## 2017-08-22 LAB — CBC WITH DIFFERENTIAL/PLATELET
Basophils Absolute: 0.1 10*3/uL (ref 0–0.1)
Basophils Relative: 1 %
Eosinophils Absolute: 0.2 10*3/uL (ref 0–0.7)
Eosinophils Relative: 3 %
HCT: 41.9 % (ref 35.0–47.0)
HEMOGLOBIN: 14.6 g/dL (ref 12.0–16.0)
LYMPHS ABS: 2 10*3/uL (ref 1.0–3.6)
LYMPHS PCT: 30 %
MCH: 30.6 pg (ref 26.0–34.0)
MCHC: 34.9 g/dL (ref 32.0–36.0)
MCV: 87.5 fL (ref 80.0–100.0)
MONOS PCT: 8 %
Monocytes Absolute: 0.6 10*3/uL (ref 0.2–0.9)
NEUTROS PCT: 58 %
Neutro Abs: 3.9 10*3/uL (ref 1.4–6.5)
Platelets: 205 10*3/uL (ref 150–440)
RBC: 4.79 MIL/uL (ref 3.80–5.20)
RDW: 13.3 % (ref 11.5–14.5)
WBC: 6.7 10*3/uL (ref 3.6–11.0)

## 2017-08-22 LAB — COMPREHENSIVE METABOLIC PANEL
ALT: 20 U/L (ref 14–54)
ANION GAP: 9 (ref 5–15)
AST: 18 U/L (ref 15–41)
Albumin: 4.8 g/dL (ref 3.5–5.0)
Alkaline Phosphatase: 64 U/L (ref 38–126)
BILIRUBIN TOTAL: 0.8 mg/dL (ref 0.3–1.2)
BUN: 18 mg/dL (ref 6–20)
CO2: 21 mmol/L — ABNORMAL LOW (ref 22–32)
Calcium: 9.2 mg/dL (ref 8.9–10.3)
Chloride: 108 mmol/L (ref 101–111)
Creatinine, Ser: 0.96 mg/dL (ref 0.44–1.00)
GFR calc Af Amer: >60 mL/min (ref >60)
Glucose, Bld: 86 mg/dL (ref 65–99)
POTASSIUM: 4.3 mmol/L (ref 3.5–5.1)
Sodium: 138 mmol/L (ref 135–145)
TOTAL PROTEIN: 8 g/dL (ref 6.5–8.1)

## 2017-08-22 LAB — LIPID PANEL
CHOLESTEROL: 281 mg/dL — AB (ref 0–200)
HDL: 55 mg/dL (ref >40)
LDL Cholesterol: 204 mg/dL — ABNORMAL HIGH (ref 0–99)
Total CHOL/HDL Ratio: 5.1 RATIO
Triglycerides: 110 mg/dL (ref <150)
VLDL: 22 mg/dL (ref 0–40)

## 2017-08-22 LAB — TSH: TSH: 3.194 u[IU]/mL (ref 0.350–4.500)

## 2017-08-24 LAB — VITAMIN D 25 HYDROXY (VIT D DEFICIENCY, FRACTURES): Vit D, 25-Hydroxy: 32 ng/mL (ref 30.0–100.0)

## 2017-08-28 DIAGNOSIS — Z1231 Encounter for screening mammogram for malignant neoplasm of breast: Secondary | ICD-10-CM | POA: Diagnosis not present

## 2017-09-09 DIAGNOSIS — K2270 Barrett's esophagus without dysplasia: Secondary | ICD-10-CM | POA: Diagnosis not present

## 2017-09-14 DIAGNOSIS — R928 Other abnormal and inconclusive findings on diagnostic imaging of breast: Secondary | ICD-10-CM | POA: Diagnosis not present

## 2017-09-14 DIAGNOSIS — R922 Inconclusive mammogram: Secondary | ICD-10-CM | POA: Diagnosis not present

## 2017-09-29 DIAGNOSIS — K317 Polyp of stomach and duodenum: Secondary | ICD-10-CM | POA: Diagnosis not present

## 2017-09-29 DIAGNOSIS — K219 Gastro-esophageal reflux disease without esophagitis: Secondary | ICD-10-CM | POA: Diagnosis not present

## 2017-09-29 DIAGNOSIS — D131 Benign neoplasm of stomach: Secondary | ICD-10-CM | POA: Diagnosis not present

## 2017-09-29 DIAGNOSIS — K2270 Barrett's esophagus without dysplasia: Secondary | ICD-10-CM | POA: Diagnosis not present

## 2017-09-29 DIAGNOSIS — K210 Gastro-esophageal reflux disease with esophagitis: Secondary | ICD-10-CM | POA: Diagnosis not present

## 2017-09-29 DIAGNOSIS — K2970 Gastritis, unspecified, without bleeding: Secondary | ICD-10-CM | POA: Diagnosis not present

## 2018-03-30 ENCOUNTER — Encounter: Payer: Self-pay | Admitting: Internal Medicine

## 2018-03-30 ENCOUNTER — Ambulatory Visit (INDEPENDENT_AMBULATORY_CARE_PROVIDER_SITE_OTHER): Payer: No Typology Code available for payment source | Admitting: Internal Medicine

## 2018-03-30 VITALS — BP 134/70 | HR 74 | Temp 99.1°F | Ht 68.5 in | Wt 198.8 lb

## 2018-03-30 DIAGNOSIS — Z1159 Encounter for screening for other viral diseases: Secondary | ICD-10-CM

## 2018-03-30 DIAGNOSIS — Z1389 Encounter for screening for other disorder: Secondary | ICD-10-CM

## 2018-03-30 DIAGNOSIS — E785 Hyperlipidemia, unspecified: Secondary | ICD-10-CM | POA: Diagnosis not present

## 2018-03-30 DIAGNOSIS — Z789 Other specified health status: Secondary | ICD-10-CM | POA: Diagnosis not present

## 2018-03-30 DIAGNOSIS — Z6830 Body mass index (BMI) 30.0-30.9, adult: Secondary | ICD-10-CM | POA: Diagnosis not present

## 2018-03-30 DIAGNOSIS — Z13818 Encounter for screening for other digestive system disorders: Secondary | ICD-10-CM

## 2018-03-30 DIAGNOSIS — E6609 Other obesity due to excess calories: Secondary | ICD-10-CM | POA: Insufficient documentation

## 2018-03-30 DIAGNOSIS — Z780 Asymptomatic menopausal state: Secondary | ICD-10-CM | POA: Diagnosis not present

## 2018-03-30 DIAGNOSIS — Z0184 Encounter for antibody response examination: Secondary | ICD-10-CM

## 2018-03-30 DIAGNOSIS — IMO0001 Reserved for inherently not codable concepts without codable children: Secondary | ICD-10-CM

## 2018-03-30 MED ORDER — ATORVASTATIN CALCIUM 10 MG PO TABS: 10.0000 mg | ORAL_TABLET | Freq: Every day | ORAL | 3 refills | Status: DC

## 2018-03-30 MED ORDER — PHENTERMINE HCL 37.5 MG PO TABS: 37.5000 mg | ORAL_TABLET | Freq: Every day | ORAL | 0 refills | Status: DC

## 2018-03-30 NOTE — Progress Notes (Signed)
Pre visit review using our clinic review tool, if applicable. No additional management support is needed unless otherwise documented below in the visit note. 

## 2018-03-30 NOTE — Patient Instructions (Signed)
F/u in 1 month sooner if needed   Cholesterol Cholesterol is a white, waxy, fat-like substance that is needed by the human body in small amounts. The liver makes all the cholesterol we need. Cholesterol is carried from the liver by the blood through the blood vessels. Deposits of cholesterol (plaques) may build up on blood vessel (artery) walls. Plaques make the arteries narrower and stiffer. Cholesterol plaques increase the risk for heart attack and stroke. You cannot feel your cholesterol level even if it is very high. The only way to know that it is high is to have a blood test. Once you know your cholesterol levels, you should keep a record of the test results. Work with your health care provider to keep your levels in the desired range. What do the results mean?  Total cholesterol is a rough measure of all the cholesterol in your blood.  LDL (low-density lipoprotein) is the "bad" cholesterol. This is the type that causes plaque to build up on the artery walls. You want this level to be low.  HDL (high-density lipoprotein) is the "good" cholesterol because it cleans the arteries and carries the LDL away. You want this level to be high.  Triglycerides are fat that the body can either burn for energy or store. High levels are closely linked to heart disease. What are the desired levels of cholesterol?  Total cholesterol below 200.  LDL below 100 for people who are at risk, below 70 for people at very high risk.  HDL above 40 is good. A level of 60 or higher is considered to be protective against heart disease.  Triglycerides below 150. How can I lower my cholesterol? Diet Follow your diet program as told by your health care provider.  Choose fish or white meat chicken and [place], roasted or baked. Limit fatty cuts of red meat, fried foods, and processed meats, such as sausage and lunch meats.  Eat lots of fresh fruits and vegetables.  Choose whole grains, beans, pasta, potatoes, and  cereals.  Choose olive oil, corn oil, or canola oil, and use only small amounts.  Avoid butter, mayonnaise, shortening, or palm kernel oils.  Avoid foods with trans fats.  Drink skim or nonfat milk and eat low-fat or nonfat yogurt and cheeses. Avoid whole milk, cream, ice cream, egg yolks, and full-fat cheeses.  Healthier desserts include angel food cake, ginger snaps, animal crackers, hard candy, popsicles, and low-fat or nonfat frozen yogurt. Avoid pastries, cakes, pies, and cookies.  Exercise  Follow your exercise program as told by your health care provider. A regular program: ? Helps to decrease LDL and raise HDL. ? Helps with weight control.  Do things that increase your activity level, such as gardening, walking, and taking the stairs.  Ask your health care provider about ways that you can be more active in your daily life.  Medicine  Take over-the-counter and prescription medicines only as told by your health care provider. ? Medicine may be prescribed by your health care provider to help lower cholesterol and decrease the risk for heart disease. This is usually done if diet and exercise have failed to bring down cholesterol levels. ? If you have several risk factors, you may need medicine even if your levels are normal.  This information is not intended to replace advice given to you by your health care provider. Make sure you discuss any questions you have with your health care provider. Document Released: 08/05/2001 Document Revised: 06/07/2016 Document Reviewed: 05/10/2016 Elsevier  Interactive Patient Education  Sarah Dennis.

## 2018-03-30 NOTE — Progress Notes (Addendum)
Chief Complaint  Patient presents with  . Establish Care   New patient  1. BMI ~30 wants to lose wt working out 3x per week and likes sweets but tries to limit wants to be on adipex again as helped lose wt in the past lowest wt 140s in 20s  2. HLD reviewed never on medication for  3. H/o shingles in left eye x 2 and wants new shingrix vaccine had zostervax year ago  Review of Systems  Constitutional: Negative for weight loss.  HENT: Negative for hearing loss.   Eyes: Negative for blurred vision and photophobia.  Respiratory: Negative for shortness of breath.   Cardiovascular: Negative for chest pain.  Gastrointestinal: Negative for abdominal pain.  Genitourinary:       +vaginal dryness   Musculoskeletal: Negative for falls.  Skin: Negative for rash.  Neurological: Negative for headaches.  Psychiatric/Behavioral: Negative for depression.   Past Medical History:  Diagnosis Date  . Carpal tunnel syndrome 01/08/2016   Right  . Esophagitis   . GERD (gastroesophageal reflux disease)    Past Surgical History:  Procedure Laterality Date  . CESAREAN SECTION  1988  . COLONOSCOPY  07/2013   Dr. Vira Agar  . mole removal  15 yaers ago  . TUBAL LIGATION  2000  . VEIN SURGERY  2007,2012   No family history on file. Social History   Socioeconomic History  . Marital status: Married    Spouse name: Not on file  . Number of children: Not on file  . Years of education: Not on file  . Highest education level: Not on file  Occupational History  . Not on file  Social Needs  . Financial resource strain: Not on file  . Food insecurity:    Worry: Not on file    Inability: Not on file  . Transportation needs:    Medical: Not on file    Non-medical: Not on file  Tobacco Use  . Smoking status: Never Smoker  . Smokeless tobacco: Never Used  Substance and Sexual Activity  . Alcohol use: Yes  . Drug use: No  . Sexual activity: Not on file  Lifestyle  . Physical activity:    Days per  week: Not on file    Minutes per session: Not on file  . Stress: Not on file  Relationships  . Social connections:    Talks on phone: Not on file    Gets together: Not on file    Attends religious service: Not on file    Active member of club or organization: Not on file    Attends meetings of clubs or organizations: Not on file    Relationship status: Not on file  . Intimate partner violence:    Fear of current or ex partner: Not on file    Emotionally abused: Not on file    Physically abused: Not on file    Forced sexual activity: Not on file  Other Topics Concern  . Not on file  Social History Narrative  . Not on file   Current Meds  Medication Sig  . doxylamine, Sleep, (UNISOM) 25 MG tablet Take 25 mg by mouth at bedtime.  . Estradiol Acetate (FEMRING) 0.05 MG/24HR RING   . glucosamine-chondroitin 500-400 MG tablet Take 1 tablet by mouth 3 (three) times daily.  . metoCLOPramide (REGLAN) 10 MG tablet Take 10 mg by mouth every 6 (six) hours as needed for nausea.  . pantoprazole (PROTONIX) 40 MG tablet Take 40 mg by  mouth daily.  . progesterone (PROMETRIUM) 200 MG capsule    Allergies  Allergen Reactions  . Corn Starch Nausea And Vomiting  . Gluten Meal Nausea And Vomiting  . Percocet [Oxycodone-Acetaminophen] Itching   No results found for this or any previous visit (from the past 2160 hour(s)). Objective  Body mass index is 29.79 kg/m. Wt Readings from Last 3 Encounters:  03/30/18 198 lb 12.8 oz (90.2 kg)  05/12/17 198 lb (89.8 kg)  02/15/15 182 lb (82.6 kg)   Temp Readings from Last 3 Encounters:  03/30/18 99.1 F (37.3 C) (Oral)  03/17/17 99 F (37.2 C)  05/02/16 98.6 F (37 C)   BP Readings from Last 3 Encounters:  03/30/18 134/70  05/12/17 108/76  03/17/17 120/90   Pulse Readings from Last 3 Encounters:  03/30/18 74  05/12/17 82  03/17/17 90    Physical Exam  Constitutional: She is oriented to person, place, and time. Vital signs are normal. She  appears well-developed and well-nourished. She is cooperative.  HENT:  Head: Normocephalic and atraumatic.  Mouth/Throat: Oropharynx is clear and moist and mucous membranes are normal.  Eyes: Pupils are equal, round, and reactive to light. Conjunctivae are normal.  Cardiovascular: Normal rate, regular rhythm and normal heart sounds.  Pulmonary/Chest: Effort normal and breath sounds normal.  Neurological: She is alert and oriented to person, place, and time. Gait normal.  Skin: Skin is warm, dry and intact.  Psychiatric: She has a normal mood and affect. Her speech is normal and behavior is normal. Judgment and thought content normal. Cognition and memory are normal.  Nursing note and vitals reviewed.   Assessment   1. Obesity  2. HLD  3. HM Plan  1. adipex f/u in 1 month  2. lipitor 10 mg  Cholesterol handout  3.  Had flu shot  Tdap ? Year  shingrix given rx today has zosterax  Check hep B/C, UA, MMR ,HIV today   Records [facility] EGD/colonoscopy  Pap 06/2017 get record Dr. Georga Bora H/o skin tags removal no h/o skin issues  Will refer for mammogram at f/u due 09/14/18 will rfer [place]  dexa normal 01/09/14  Reviewed other labs 08/22/17 normal  Menopausal using femring failed replens in past   EGD 09/29/17 +Barretts [facility] multiple polyps pedunculated and sessile Also sees [facility]  Dr. Altha Harm dentist   Provider: Dr. Olivia Mackie [facility]

## 2018-03-31 LAB — MICROSCOPIC EXAMINATION
CASTS: NONE SEEN /LPF
Epithelial Cells (non renal): >10 /hpf — AB (ref 0–10)

## 2018-03-31 LAB — URINALYSIS, ROUTINE W REFLEX MICROSCOPIC
Bilirubin, UA: NEGATIVE
Glucose, UA: NEGATIVE
KETONES UA: NEGATIVE
NITRITE UA: NEGATIVE
PH UA: 6.5 (ref 5.0–7.5)
Protein, UA: NEGATIVE
RBC, UA: NEGATIVE
Specific Gravity, UA: 1.013 (ref 1.005–1.030)
UUROB: 0.2 mg/dL (ref 0.2–1.0)

## 2018-03-31 LAB — HEPATITIS C ANTIBODY
HEP C AB: NONREACTIVE
SIGNAL TO CUT-OFF: 0.01 (ref <1.00)

## 2018-03-31 LAB — HEPATITIS B SURFACE ANTIBODY, QUANTITATIVE: Hepatitis B-Post: 88 m[IU]/mL (ref >=10)

## 2018-03-31 LAB — MEASLES/MUMPS/RUBELLA IMMUNITY
Rubella: 17.4 index
Rubeola IgG: >300 AU/mL

## 2018-03-31 LAB — HIV ANTIBODY (ROUTINE TESTING W REFLEX): HIV: NONREACTIVE

## 2018-04-20 ENCOUNTER — Telehealth: Payer: Self-pay | Admitting: *Deleted

## 2018-04-20 NOTE — Telephone Encounter (Signed)
Copied from [place] [phone]. Topic: Appointment Scheduling - Scheduling Inquiry for Clinic >> Apr 20, 2018  2:25 PM Synthia Innocent wrote: Reason for CRM: Patient is needing CPE prior to May 24, 2018 per insurance. Able to work in? Please advise

## 2018-04-20 NOTE — Telephone Encounter (Signed)
Ok to place in any 30 minute slot available?

## 2018-04-20 NOTE — Telephone Encounter (Signed)
Anytime after 04/30/18  Before 05/24/18  Can do on weds if I do not have >8 pts in my 1/2 day or any other day   Cole[place]

## 2018-04-21 NOTE — Telephone Encounter (Signed)
Ok thanks TMS 

## 2018-04-21 NOTE — Telephone Encounter (Signed)
Patient has an appointment for follow up on 05/04/18 on [facility]term[facility] patient ask could CPE be completed at this appointment.

## 2018-04-30 ENCOUNTER — Telehealth: Payer: Self-pay

## 2018-04-30 ENCOUNTER — Encounter: Payer: Self-pay | Admitting: Internal Medicine

## 2018-04-30 NOTE — Telephone Encounter (Signed)
Please advise 

## 2018-04-30 NOTE — Telephone Encounter (Signed)
Pt called to check status of request for cpe on next visit, call pt to confirm decision of the physician when possible

## 2018-04-30 NOTE — Telephone Encounter (Signed)
Patient has been informed of the below.

## 2018-04-30 NOTE — Telephone Encounter (Signed)
Copied from [place] [id number]. Topic: Inquiry >> Apr 30, 2018 10:44 AM Pricilla Handler wrote: Reason for CRM: Patient called to inquire if Dr. Olivia Mackie would perform her Physical during her office visit on 05/04/2018. Patient is a [employer], and needs the physical before 05/24/2018 for the [employer].        Thank You!!!

## 2018-05-04 ENCOUNTER — Ambulatory Visit (INDEPENDENT_AMBULATORY_CARE_PROVIDER_SITE_OTHER): Payer: No Typology Code available for payment source | Admitting: Internal Medicine

## 2018-05-04 ENCOUNTER — Encounter: Payer: Self-pay | Admitting: Internal Medicine

## 2018-05-04 VITALS — BP 116/76 | HR 81 | Temp 98.6°F | Ht 68.5 in | Wt 189.8 lb

## 2018-05-04 DIAGNOSIS — Z8349 Family history of other endocrine, nutritional and metabolic diseases: Secondary | ICD-10-CM

## 2018-05-04 DIAGNOSIS — E785 Hyperlipidemia, unspecified: Secondary | ICD-10-CM

## 2018-05-04 DIAGNOSIS — E6609 Other obesity due to excess calories: Secondary | ICD-10-CM | POA: Diagnosis not present

## 2018-05-04 DIAGNOSIS — E663 Overweight: Secondary | ICD-10-CM | POA: Diagnosis not present

## 2018-05-04 DIAGNOSIS — K2270 Barrett's esophagus without dysplasia: Secondary | ICD-10-CM | POA: Diagnosis not present

## 2018-05-04 DIAGNOSIS — Z1329 Encounter for screening for other suspected endocrine disorder: Secondary | ICD-10-CM

## 2018-05-04 DIAGNOSIS — Z1231 Encounter for screening mammogram for malignant neoplasm of breast: Secondary | ICD-10-CM

## 2018-05-04 DIAGNOSIS — Z0000 Encounter for general adult medical examination without abnormal findings: Secondary | ICD-10-CM

## 2018-05-04 DIAGNOSIS — Z6830 Body mass index (BMI) 30.0-30.9, adult: Secondary | ICD-10-CM

## 2018-05-04 MED ORDER — PHENTERMINE HCL 37.5 MG PO TABS: 37.5000 mg | ORAL_TABLET | Freq: Every day | ORAL | 0 refills | Status: DC

## 2018-05-04 NOTE — Progress Notes (Addendum)
Chief Complaint  Patient presents with  . Follow-up  . Annual Exam   Annual  1. Overweight doing well on adipex lost 9 lbs. Side effects dry mouth otherwise tolerating  2. HLD she only took lipitor x 2 days c/w side effects I.e leg cramps and she already has those her sister told her about Reliv luna capsule she is taking 1 per day x 3 weeks and if she were to consider statin she wants to try zocor like her husband 3. FH hypothyroidism/goiter in both parents and her brother and wants thyroid labs checked in the future    Review of Systems  Constitutional: Positive for weight loss.       Down 9 lbs on adipex   HENT: Negative for hearing loss.   Eyes: Negative for blurred vision.  Respiratory: Negative for shortness of breath.   Cardiovascular: Negative for chest pain.  Gastrointestinal: Negative for abdominal pain.  Musculoskeletal: Negative for falls.  Skin: Negative for rash.  Neurological: Negative for headaches.  Psychiatric/Behavioral: Negative for depression.   Past Medical History:  Diagnosis Date  . Arthritis   . Barrett's esophagus   . Carpal tunnel syndrome 01/08/2016   Right  . Esophagitis   . Esophagitis    eosinophillic   . GERD (gastroesophageal reflux disease)   . Hyperlipidemia   . Shingles    left eye x 2    Past Surgical History:  Procedure Laterality Date  . CESAREAN SECTION  1988  . COLONOSCOPY  07/2013   Dr. Vira Agar  . mole removal  15 yaers ago  . [place]     2007  . TUBAL LIGATION  2000  . VEIN SURGERY  2007,2012   Family History  Problem Relation Age of Onset  . Cancer Mother        pancreatitic cancer   . Depression Father   . Dementia Father   . Bipolar disorder Father   . Depression Sister   . Hyperlipidemia Sister   . Hyperlipidemia Brother   . Heart disease Paternal Grandfather        MI   Social History   Socioeconomic History  . Marital status: Married    Spouse name: Not on file  . Number of children: 2  .  Years of education: Not on file  . Highest education level: Not on file  Occupational History  . Not on file  Social Needs  . Financial resource strain: Not on file  . Food insecurity:    Worry: Not on file    Inability: Not on file  . Transportation needs:    Medical: Not on file    Non-medical: Not on file  Tobacco Use  . Smoking status: Never Smoker  . Smokeless tobacco: Never Used  Substance and Sexual Activity  . Alcohol use: Yes    Comment: occasionally  . Drug use: No  . Sexual activity: Yes    Comment: men  Lifestyle  . Physical activity:    Days per week: Not on file    Minutes per session: Not on file  . Stress: Not on file  Relationships  . Social connections:    Talks on phone: Not on file    Gets together: Not on file    Attends religious service: Not on file    Active member of club or organization: Not on file    Attends meetings of clubs or organizations: Not on file    Relationship status: Not on file  .  Intimate partner violence:    Fear of current or ex partner: Not on file    Emotionally abused: Not on file    Physically abused: Not on file    Forced sexual activity: Not on file  Other Topics Concern  . 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 doxylamine, Sleep, (UNISOM) 25 MG tablet Take 25 mg by mouth at bedtime.  . Estradiol Acetate (FEMRING) 0.05 MG/24HR RING   . glucosamine-chondroitin 500-400 MG tablet Take 1 tablet by mouth 3 (three) times daily.  Marland Kitchen MAGNESIUM PO Take 500 mg by mouth 2 (two) times daily.  . metoCLOPramide (REGLAN) 10 MG tablet Take 10 mg by mouth every 6 (six) hours as needed for nausea.  . NON FORMULARY   . pantoprazole (PROTONIX) 40 MG tablet Take 40 mg by mouth daily.  . phentermine (ADIPEX-P) 37.5 MG tablet Take 1 tablet (37.5 mg total) by mouth daily before breakfast.  .  progesterone (PROMETRIUM) 200 MG capsule   . [DISCONTINUED] phentermine (ADIPEX-P) 37.5 MG tablet Take 1 tablet (37.5 mg total) by mouth daily before breakfast.   Allergies  Allergen Reactions  . Corn Starch Nausea And Vomiting  . Gluten Meal Nausea And Vomiting  . Percocet [Oxycodone-Acetaminophen] Itching   Recent Results (from the past 2160 hour(s))  Hepatitis B surface antibody     Status: None   Collection Time: 03/30/18  2:15 PM  Result Value Ref Range   Hepatitis B-Post 88 > OR = 10 mIU/mL    Comment: . Patient has immunity to hepatitis B virus. . For additional information, please refer to http://education.[facility].com/faq/FAQ105 (This link is being provided for informational/ educational purposes only).   Hepatitis C antibody     Status: None   Collection Time: 03/30/18  2:15 PM  Result Value Ref Range   Hepatitis C Ab NON-REACTIVE NON-REACTI   SIGNAL TO CUT-OFF 0.01 <1.00    Comment: . HCV antibody was non-reactive. There is no laboratory  evidence of HCV infection. . In most cases, no further action is required. However, if recent HCV exposure is suspected, a test for HCV RNA (test code [phone]) is suggested. . For additional information please refer to http://education.[facility].com/faq/FAQ22v1 (This link is being provided for informational/ educational purposes only.) .   HIV antibody (with reflex)     Status: None   Collection Time: 03/30/18  2:15 PM  Result Value Ref Range   HIV 1&2 Ab, 4th Generation NON-REACTIVE NON-REACTI    Comment: HIV-1 antigen and HIV-1/HIV-2 antibodies were not detected. There is no laboratory evidence of HIV infection. Marland Kitchen PLEASE NOTE: This information has been disclosed to you from records whose confidentiality may be protected by state law.  If your state requires such protection, then the state law prohibits you from making any further disclosure of the information without the specific written consent of the  person to whom it pertains, or as otherwise permitted by law. A general authorization for the release of medical or other information is NOT sufficient for this purpose. . For additional information please refer to http://education.[facility].com/faq/FAQ106 (This link is being provided for informational/ educational purposes only.) . Marland Kitchen The performance of this assay has not been clinically validated in patients less than 60 years old. Marland Kitchen  Measles/Mumps/Rubella Immunity     Status: None   Collection Time: 03/30/18  2:15 PM  Result Value Ref Range   [place] IgG >300.00 AU/mL    Comment: AU/mL            Interpretation -----            -------------- <25.00           Negative 25.00-29.99      Equivocal >29.99           Positive . A positive result indicates that the patient has antibody to measles virus. It does not differentiate  between an active or past infection. The clinical  diagnosis must be interpreted in conjunction with  clinical signs and symptoms of the patient.    Mumps IgG >300.00 AU/mL    Comment:  AU/mL           Interpretation -------         ---------------- <9.00             Negative 9.00-10.99        Equivocal >10.99            Positive A positive result indicates that the patient has  antibody to mumps virus. It does not differentiate between an  active or past infection. The clinical diagnosis must be interpreted in conjunction with clinical signs and symptoms of the patient. .    Rubella 17.40 index    Comment:     Index            Interpretation     -----            --------------       <0.90            Not consistent with Immunity     0.90-0.99        Equivocal     > or = 1.00      Consistent with Immunity  . The presence of rubella IgG antibody suggests  immunization or past or current infection with rubella virus.   Urinalysis, Routine w reflex microscopic     Status: Abnormal   Collection Time: 03/30/18  2:15 PM  Result Value Ref  Range   Specific Gravity, UA 1.013 1.005 - 1.030   pH, UA 6.5 5.0 - 7.5   Color, UA Yellow Yellow   Appearance Ur Cloudy (A) Clear   Leukocytes, UA Trace (A) Negative   Protein, UA Negative Negative/Trace   Glucose, UA Negative Negative   Ketones, UA Negative Negative   RBC, UA Negative Negative   Bilirubin, UA Negative Negative   Urobilinogen, Ur 0.2 0.2 - 1.0 mg/dL   Nitrite, UA Negative Negative   Microscopic Examination See below:     Comment: Microscopic was indicated and was performed.  Microscopic Examination     Status: Abnormal   Collection Time: 03/30/18  2:15 PM  Result Value Ref Range   WBC, UA 0-5 0 - 5 /hpf   RBC, UA 0-2 0 - 2 /hpf   Epithelial Cells (non renal) >10 (A) 0 - 10 /hpf   Casts None seen None seen /lpf   Mucus, UA Present Not Estab.   Bacteria, UA Few None seen/Few   Objective  Body mass index is 28.44 kg/m. Wt Readings from Last 3 Encounters:  05/04/18 189 lb 12.8 oz (86.1 kg)  03/30/18 198 lb 12.8 oz (90.2 kg)  05/12/17 198 lb (89.8 kg)   Temp Readings from Last 3  Encounters:  05/04/18 98.6 F (37 C) (Oral)  03/30/18 99.1 F (37.3 C) (Oral)  03/17/17 99 F (37.2 C)   BP Readings from Last 3 Encounters:  05/04/18 116/76  03/30/18 134/70  05/12/17 108/76   Pulse Readings from Last 3 Encounters:  05/04/18 81  03/30/18 74  05/12/17 82    Physical Exam  Constitutional: She is oriented to person, place, and time. Vital signs are normal. She appears well-developed and well-nourished. She is cooperative.  HENT:  Head: Normocephalic and atraumatic.  Mouth/Throat: Oropharynx is clear and moist and mucous membranes are normal.  Nl ears b/l   Eyes: Pupils are equal, round, and reactive to light. Conjunctivae are normal.  Neck: No thyroid mass and no thyromegaly present.  Cardiovascular: Normal rate, regular rhythm and normal heart sounds.  Pulmonary/Chest: Effort normal and breath sounds normal. Right breast exhibits no inverted nipple, no  mass, no nipple discharge, no skin change and no tenderness. Left breast exhibits no inverted nipple, no mass, no nipple discharge, no skin change and no tenderness. No breast swelling, tenderness, discharge or bleeding. Breasts are symmetrical.  Abdominal: Soft. Normal appearance and bowel sounds are normal.  Lymphadenopathy:    She has no axillary adenopathy.  Neurological: She is alert and oriented to person, place, and time. Gait normal.  Skin: Skin is warm, dry and intact.  Psychiatric: She has a normal mood and affect. Her speech is normal and behavior is normal. Judgment and thought content normal. Cognition and memory are normal.  Nursing note and vitals reviewed.   Assessment   1.annual exam  2. HLD  3. FH hypothyroidism  4. H/o barretts since 2014  5. Overweight BMI 28.44  Plan   1.  Had flu shot 08/02/17  Tdap had 03/03/10 confirmed with pt  Has Rx shingrix will get [facility] Protected MMR titer checked 03/31/08  immune, hep B immune titer 1028 02/09/09, no hep C or HIV  quantiferon gold neg 03/31/08 Varicella immune 03/31/08   Breast exam normal today rec sch mammogram due 09/14/18 referred [place] pt to sch  Still need to get pap from Dr. Cherylann Banas ob/gyn Colonoscopy get report and also EGD Dr. Tiffany Kocher colonoscopy 08/23/2013 hemorrhoids will confirm no polyps rec repeat in 10 years per note EGD + barretts since 2014 last EGD 09/29/17 will get report and pathology of EGD and colonscopy  sch fasting labs due 07/2018  rec healthy diet choices and exercise  Does not f/u dermatology  2.  rec statin if pt takes would like zocor instead of lipitor pending labs  Given cholesterol handout  3. Check thyroid labs upcoming  4. See above #1  5.  Congratulated on wt loss  Rx 2/3 Adipex today f/u in 1 month   Provider: Dr. Olivia Mackie Mc[facility]-Internal Medicine

## 2018-05-04 NOTE — Patient Instructions (Addendum)
Please f/u in 1 month for Adipex with me    Sch fasting labs 07/26/18 or after  Please schedule mammogram 09/14/18 [place] call to schedule   Cholesterol Cholesterol is a white, waxy, fat-like substance that is needed by the human body in small amounts. The liver makes all the cholesterol we need. Cholesterol is carried from the liver by the blood through the blood vessels. Deposits of cholesterol (plaques) may build up on blood vessel (artery) walls. Plaques make the arteries narrower and stiffer. Cholesterol plaques increase the risk for heart attack and stroke. You cannot feel your cholesterol level even if it is very high. The only way to know that it is high is to have a blood test. Once you know your cholesterol levels, you should keep a record of the test results. Work with your health care provider to keep your levels in the desired range. What do the results mean?  Total cholesterol is a rough measure of all the cholesterol in your blood.  LDL (low-density lipoprotein) is the "bad" cholesterol. This is the type that causes plaque to build up on the artery walls. You want this level to be low.  HDL (high-density lipoprotein) is the "good" cholesterol because it cleans the arteries and carries the LDL away. You want this level to be high.  Triglycerides are fat that the body can either burn for energy or store. High levels are closely linked to heart disease. What are the desired levels of cholesterol?  Total cholesterol below 200.  LDL below 100 for people who are at risk, below 70 for people at very high risk.  HDL above 40 is good. A level of 60 or higher is considered to be protective against heart disease.  Triglycerides below 150. How can I lower my cholesterol? Diet Follow your diet program as told by your health care provider.  Choose fish or white meat chicken and [place], roasted or baked. Limit fatty cuts of red meat, fried foods, and processed meats, such as sausage  and lunch meats.  Eat lots of fresh fruits and vegetables.  Choose whole grains, beans, pasta, potatoes, and cereals.  Choose olive oil, corn oil, or canola oil, and use only small amounts.  Avoid butter, mayonnaise, shortening, or palm kernel oils.  Avoid foods with trans fats.  Drink skim or nonfat milk and eat low-fat or nonfat yogurt and cheeses. Avoid whole milk, cream, ice cream, egg yolks, and full-fat cheeses.  Healthier desserts include angel food cake, ginger snaps, animal crackers, hard candy, popsicles, and low-fat or nonfat frozen yogurt. Avoid pastries, cakes, pies, and cookies.  Exercise  Follow your exercise program as told by your health care provider. A regular program: ? Helps to decrease LDL and raise HDL. ? Helps with weight control.  Do things that increase your activity level, such as gardening, walking, and taking the stairs.  Ask your health care provider about ways that you can be more active in your daily life.  Medicine  Take over-the-counter and prescription medicines only as told by your health care provider. ? Medicine may be prescribed by your health care provider to help lower cholesterol and decrease the risk for heart disease. This is usually done if diet and exercise have failed to bring down cholesterol levels. ? If you have several risk factors, you may need medicine even if your levels are normal.  This information is not intended to replace advice given to you by your health care provider. Make sure you  discuss any questions you have with your health care provider. Document Released: 08/05/2001 Document Revised: 06/07/2016 Document Reviewed: 05/10/2016 Elsevier Interactive Patient Education  [employer].

## 2018-05-04 NOTE — Progress Notes (Signed)
Pre visit review using our clinic review tool, if applicable. No additional management support is needed unless otherwise documented below in the visit note. 

## 2018-05-07 ENCOUNTER — Telehealth: Payer: Self-pay | Admitting: Internal Medicine

## 2018-05-07 NOTE — Telephone Encounter (Signed)
Pt dropped off a copy of her shot record. Paper is up front in Dr. Marcial Pacas color folder.

## 2018-06-01 ENCOUNTER — Encounter: Payer: Self-pay | Admitting: Internal Medicine

## 2018-06-01 ENCOUNTER — Ambulatory Visit (INDEPENDENT_AMBULATORY_CARE_PROVIDER_SITE_OTHER): Payer: No Typology Code available for payment source | Admitting: Internal Medicine

## 2018-06-01 VITALS — BP 100/62 | HR 86 | Temp 98.2°F | Ht 68.5 in | Wt 182.2 lb

## 2018-06-01 DIAGNOSIS — K2270 Barrett's esophagus without dysplasia: Secondary | ICD-10-CM | POA: Diagnosis not present

## 2018-06-01 DIAGNOSIS — K22719 Barrett's esophagus with dysplasia, unspecified: Secondary | ICD-10-CM | POA: Diagnosis not present

## 2018-06-01 DIAGNOSIS — Z780 Asymptomatic menopausal state: Secondary | ICD-10-CM

## 2018-06-01 DIAGNOSIS — E785 Hyperlipidemia, unspecified: Secondary | ICD-10-CM

## 2018-06-01 DIAGNOSIS — R112 Nausea with vomiting, unspecified: Secondary | ICD-10-CM | POA: Diagnosis not present

## 2018-06-01 DIAGNOSIS — E663 Overweight: Secondary | ICD-10-CM

## 2018-06-01 DIAGNOSIS — K9041 Non-celiac gluten sensitivity: Secondary | ICD-10-CM

## 2018-06-01 HISTORY — DX: Asymptomatic menopausal state: Z78.0

## 2018-06-01 MED ORDER — PHENTERMINE HCL 37.5 MG PO TABS: 37.5000 mg | ORAL_TABLET | Freq: Every day | ORAL | 0 refills | Status: DC

## 2018-06-01 MED ORDER — PANTOPRAZOLE SODIUM 40 MG PO TBEC: 40.0000 mg | DELAYED_RELEASE_TABLET | Freq: Every day | ORAL | 1 refills | Status: DC

## 2018-06-01 MED ORDER — ESTRADIOL 2 MG VA RING: 0.0500 mg | VAGINAL_RING | VAGINAL | 4 refills | Status: DC

## 2018-06-01 MED ORDER — METOCLOPRAMIDE HCL 10 MG PO TABS: 10.0000 mg | ORAL_TABLET | Freq: Every day | ORAL | 1 refills | Status: DC | PRN

## 2018-06-01 NOTE — Patient Instructions (Signed)
F/u 08/2018    Cholesterol Cholesterol is a white, waxy, fat-like substance that is needed by the human body in small amounts. The liver makes all the cholesterol we need. Cholesterol is carried from the liver by the blood through the blood vessels. Deposits of cholesterol (plaques) may build up on blood vessel (artery) walls. Plaques make the arteries narrower and stiffer. Cholesterol plaques increase the risk for heart attack and stroke. You cannot feel your cholesterol level even if it is very high. The only way to know that it is high is to have a blood test. Once you know your cholesterol levels, you should keep a record of the test results. Work with your health care provider to keep your levels in the desired range. What do the results mean?  Total cholesterol is a rough measure of all the cholesterol in your blood.  LDL (low-density lipoprotein) is the "bad" cholesterol. This is the type that causes plaque to build up on the artery walls. You want this level to be low.  HDL (high-density lipoprotein) is the "good" cholesterol because it cleans the arteries and carries the LDL away. You want this level to be high.  Triglycerides are fat that the body can either burn for energy or store. High levels are closely linked to heart disease. What are the desired levels of cholesterol?  Total cholesterol below 200.  LDL below 100 for people who are at risk, below 70 for people at very high risk.  HDL above 40 is good. A level of 60 or higher is considered to be protective against heart disease.  Triglycerides below 150. How can I lower my cholesterol? Diet Follow your diet program as told by your health care provider.  Choose fish or white meat chicken and [place], roasted or baked. Limit fatty cuts of red meat, fried foods, and processed meats, such as sausage and lunch meats.  Eat lots of fresh fruits and vegetables.  Choose whole grains, beans, pasta, potatoes, and cereals.  Choose  olive oil, corn oil, or canola oil, and use only small amounts.  Avoid butter, mayonnaise, shortening, or palm kernel oils.  Avoid foods with trans fats.  Drink skim or nonfat milk and eat low-fat or nonfat yogurt and cheeses. Avoid whole milk, cream, ice cream, egg yolks, and full-fat cheeses.  Healthier desserts include angel food cake, ginger snaps, animal crackers, hard candy, popsicles, and low-fat or nonfat frozen yogurt. Avoid pastries, cakes, pies, and cookies.  Exercise  Follow your exercise program as told by your health care provider. A regular program: ? Helps to decrease LDL and raise HDL. ? Helps with weight control.  Do things that increase your activity level, such as gardening, walking, and taking the stairs.  Ask your health care provider about ways that you can be more active in your daily life.  Medicine  Take over-the-counter and prescription medicines only as told by your health care provider. ? Medicine may be prescribed by your health care provider to help lower cholesterol and decrease the risk for heart disease. This is usually done if diet and exercise have failed to bring down cholesterol levels. ? If you have several risk factors, you may need medicine even if your levels are normal.  This information is not intended to replace advice given to you by your health care provider. Make sure you discuss any questions you have with your health care provider. Document Released: 08/05/2001 Document Revised: 06/07/2016 Document Reviewed: 05/10/2016 Elsevier Interactive Patient Education  2018 Elsevier Inc.  

## 2018-06-01 NOTE — Progress Notes (Signed)
Pre visit review using our clinic review tool, if applicable. No additional management support is needed unless otherwise documented below in the visit note. 

## 2018-06-01 NOTE — Progress Notes (Signed)
Chief Complaint  Patient presents with  . Follow-up   F/u weight and menopause  1. Overweight wants refill adipex lost wt goal 165 or less  2. barretts [facility] f/u wants refill of ppi protonix and will check again on EGD/colonoscopy report unable to see  3. Menopause on progesterone 200 mg qd and femring 0.05 given by ob/gyn wants pcp to fill we had disc today about hormonal therapy and I rec taper off she was taking for hot flashes she would like refill of femring but cost is a lot disc alternatives and rec per insurance agreeable to change formulation  4. Gluten intolerance wants refill of reglan which she takes prn for nausea with gluten encounter     Review of Systems  Constitutional: Positive for weight loss.  HENT: Negative for hearing loss.   Eyes: Negative for blurred vision.  Cardiovascular: Negative for chest pain.  Gastrointestinal: Negative for nausea.  Genitourinary:       Reduction in hot flashes   Skin: Negative for rash.   Past Medical History:  Diagnosis Date  . Arthritis   . Barrett's esophagus   . Carpal tunnel syndrome 01/08/2016   Right  . Esophagitis   . Esophagitis    eosinophillic   . GERD (gastroesophageal reflux disease)   . Hyperlipidemia   . Shingles    left eye x 2    Past Surgical History:  Procedure Laterality Date  . CESAREAN SECTION  1988  . COLONOSCOPY  07/2013   Dr. Vira Agar  . mole removal  15 yaers ago  . [place]     2007  . TUBAL LIGATION  2000  . VEIN SURGERY  2007,2012   Family History  Problem Relation Age of Onset  . Cancer Mother        pancreatitic cancer   . Hypothyroidism Mother   . Goiter Mother   . Depression Father   . Dementia Father   . Bipolar disorder Father   . Hypothyroidism Father   . Goiter Father   . Depression Sister   . Hyperlipidemia Sister   . Hyperlipidemia Brother   . Hypothyroidism Brother   . Heart disease Paternal Grandfather        MI   Social History   Socioeconomic History  .  Marital status: Married    Spouse name: Not on file  . Number of children: 2  . Years of education: Not on file  . Highest education level: Not on file  Occupational History  . Not on file  Social Needs  . Financial resource strain: Not on file  . Food insecurity:    Worry: Not on file    Inability: Not on file  . Transportation needs:    Medical: Not on file    Non-medical: Not on file  Tobacco Use  . Smoking status: Never Smoker  . Smokeless tobacco: Never Used  Substance and Sexual Activity  . Alcohol use: Yes    Comment: occasionally  . Drug use: No  . Sexual activity: Yes    Comment: men  Lifestyle  . Physical activity:    Days per week: Not on file    Minutes per session: Not on file  . Stress: Not on file  Relationships  . Social connections:    Talks on phone: Not on file    Gets together: Not on file    Attends religious service: Not on file    Active member of club or organization: Not  on file    Attends meetings of clubs or organizations: Not on file    Relationship status: Not on file  . Intimate partner violence:    Fear of current or ex partner: Not on file    Emotionally abused: Not on file    Physically abused: Not on file    Forced sexual activity: Not on file  Other Topics Concern  . 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 doxylamine, Sleep, (UNISOM) 25 MG tablet Take 25 mg by mouth at bedtime.  . Estradiol Acetate (FEMRING) 0.05 MG/24HR RING   . glucosamine-chondroitin 500-400 MG tablet Take 1 tablet by mouth 3 (three) times daily.  Marland Kitchen MAGNESIUM PO Take 500 mg by mouth 2 (two) times daily.  . metoCLOPramide (REGLAN) 10 MG tablet Take 10 mg by mouth every 6 (six) hours as needed for nausea.  . NON FORMULARY   . pantoprazole (PROTONIX) 40 MG tablet Take 40 mg by mouth daily.  . phentermine (ADIPEX-P) 37.5  MG tablet Take 1 tablet (37.5 mg total) by mouth daily before breakfast.  . progesterone (PROMETRIUM) 200 MG capsule    Allergies  Allergen Reactions  . Corn Starch Nausea And Vomiting  . Gluten Meal Nausea And Vomiting  . Percocet [Oxycodone-Acetaminophen] Itching   Recent Results (from the past 2160 hour(s))  Hepatitis B surface antibody     Status: None   Collection Time: 03/30/18  2:15 PM  Result Value Ref Range   Hepatitis B-Post 88 > OR = 10 mIU/mL    Comment: . Patient has immunity to hepatitis B virus. . For additional information, please refer to http://education.questdiagnostics.com/faq/FAQ105 (This link is being provided for informational/ educational purposes only).   Hepatitis C antibody     Status: None   Collection Time: 03/30/18  2:15 PM  Result Value Ref Range   Hepatitis C Ab NON-REACTIVE NON-REACTI   SIGNAL TO CUT-OFF 0.01 <1.00    Comment: . HCV antibody was non-reactive. There is no laboratory  evidence of HCV infection. . In most cases, no further action is required. However, if recent HCV exposure is suspected, a test for HCV RNA (test code [phone]) is suggested. . For additional information please refer to http://education.questdiagn[facility].com/faq/FAQ22v1 (This link is being provided for informational/ educational purposes only.) .   HIV antibody (with reflex)     Status: None   Collection Time: 03/30/18  2:15 PM  Result Value Ref Range   HIV 1&2 Ab, 4th Generation NON-REACTIVE NON-REACTI    Comment: HIV-1 antigen and HIV-1/HIV-2 antibodies were not detected. There is no laboratory evidence of HIV infection. Marland Kitchen PLEASE NOTE: This information has been disclosed to you from records whose confidentiality may be protected by state law.  If your state requires such protection, then the state law prohibits you from making any further disclosure of the information without the specific written consent of the person to whom it pertains, or as  otherwise permitted by law. A general authorization for the release of medical or other information is NOT sufficient for this purpose. . For additional information please refer to http://education.questdiagn[facility].com/faq/FAQ106 (This link is being provided for informational/ educational purposes only.) . Marland Kitchen The performance of this assay has not been clinically validated in patients less than 2  years old. .   Measles/Mumps/Rubella Immunity     Status: None   Collection Time: 03/30/18  2:15 PM  Result Value Ref Range   [place] IgG >300.00 AU/mL    Comment: AU/mL            Interpretation -----            -------------- <25.00           Negative 25.00-29.99      Equivocal >29.99           Positive . A positive result indicates that the patient has antibody to measles virus. It does not differentiate  between an active or past infection. The clinical  diagnosis must be interpreted in conjunction with  clinical signs and symptoms of the patient.    Mumps IgG >300.00 AU/mL    Comment:  AU/mL           Interpretation -------         ---------------- <9.00             Negative 9.00-10.99        Equivocal >10.99            Positive A positive result indicates that the patient has  antibody to mumps virus. It does not differentiate between an  active or past infection. The clinical diagnosis must be interpreted in conjunction with clinical signs and symptoms of the patient. .    Rubella 17.40 index    Comment:     Index            Interpretation     -----            --------------       <0.90            Not consistent with Immunity     0.90-0.99        Equivocal     > or = 1.00      Consistent with Immunity  . The presence of rubella IgG antibody suggests  immunization or past or current infection with rubella virus.   Urinalysis, Routine w reflex microscopic     Status: Abnormal   Collection Time: 03/30/18  2:15 PM  Result Value Ref Range   Specific Gravity, UA 1.013  1.005 - 1.030   pH, UA 6.5 5.0 - 7.5   Color, UA Yellow Yellow   Appearance Ur Cloudy (A) Clear   Leukocytes, UA Trace (A) Negative   Protein, UA Negative Negative/Trace   Glucose, UA Negative Negative   Ketones, UA Negative Negative   RBC, UA Negative Negative   Bilirubin, UA Negative Negative   Urobilinogen, Ur 0.2 0.2 - 1.0 mg/dL   Nitrite, UA Negative Negative   Microscopic Examination See below:     Comment: Microscopic was indicated and was performed.  Microscopic Examination     Status: Abnormal   Collection Time: 03/30/18  2:15 PM  Result Value Ref Range   WBC, UA 0-5 0 - 5 /hpf   RBC, UA 0-2 0 - 2 /hpf   Epithelial Cells (non renal) >10 (A) 0 - 10 /hpf   Casts None seen None seen /lpf   Mucus, UA Present Not Estab.   Bacteria, UA Few None seen/Few   Objective  Body mass index is 27.3 kg/m. Wt Readings from Last 3 Encounters:  06/01/18 182 lb 3.2 oz (82.6 kg)  05/04/18 189 lb 12.8 oz (86.1 kg)  03/30/18 198 lb 12.8 oz (90.2 kg)  Temp Readings from Last 3 Encounters:  06/01/18 98.2 F (36.8 C) (Oral)  05/04/18 98.6 F (37 C) (Oral)  03/30/18 99.1 F (37.3 C) (Oral)   BP Readings from Last 3 Encounters:  06/01/18 100/62  05/04/18 116/76  03/30/18 134/70   Pulse Readings from Last 3 Encounters:  06/01/18 86  05/04/18 81  03/30/18 74    Physical Exam  Constitutional: She is oriented to person, place, and time. Vital signs are normal. She appears well-developed and well-nourished. She is cooperative.  HENT:  Head: Normocephalic and atraumatic.  Mouth/Throat: Oropharynx is clear and moist and mucous membranes are normal.  Eyes: Pupils are equal, round, and reactive to light. Conjunctivae are normal.  Cardiovascular: Normal rate, regular rhythm and normal heart sounds.  Pulmonary/Chest: Effort normal and breath sounds normal.  Neurological: She is alert and oriented to person, place, and time. Gait normal.  Skin: Skin is warm, dry and intact.   Psychiatric: She has a normal mood and affect. Her speech is normal and behavior is normal. Judgment and thought content normal. Cognition and memory are normal.  Nursing note and vitals reviewed.   Assessment   1. Overweight BMI 27.3  2. barretts  3. Menopause with hot flashes  4. Gluten intoleraance  5. HM Plan  1.  adipex refilled  2. On ppi refilled today  Reviewed EGD 08/23/13 small H/H and short segment Barretts bx + neg dysplasia  Repeat EGD 09/29/17 +barretts, multiple gastric polyps nl duodenum bx squamous mucosa mild reflux type changes, chronic inflammation of gastric mucosa neg for barretts repeat in 3 years Dr. Tiffany Kocher  3. Stop femring 2/2 cost try estring stop progesterone  4. Cont prn reglan for nausea pt had this prior to me being pcp but consider prn zofran in future  5.  Had flu shot 08/02/17  Tdap had 03/03/10 confirmed with pt  Has Rx shingrix will get [facility] Protected MMR titer checked 03/31/08  immune, hep B immune titer 1028 3/19/1 no hep C or HIV  quantiferon gold neg 03/31/08 Varicella immune 03/31/08   Referred mammogram due 09/14/18 referred [place] pt to sch  Still need to get pap from Dr. Cherylann Banas ob/gyn Colonoscopy [facility] Dr. Tiffany Kocher 08/23/13 ext/int hemorrhoids repeat in 10 years  EGD + barretts since 2014 last EGD 09/29/17 see above sch fasting labs due 07/2018  rec healthy diet choices and exercise  Does not f/u dermatology   Provider: Dr. Olivia Mackie McLean-Scocuz[facility]

## 2018-06-02 ENCOUNTER — Encounter: Payer: Self-pay | Admitting: Internal Medicine

## 2018-06-02 ENCOUNTER — Telehealth: Payer: Self-pay | Admitting: Internal Medicine

## 2018-06-02 NOTE — Telephone Encounter (Signed)
1. Sarah Dennis call patient Received EGD and colonoscopy reports  Colonoscopy 08/23/13 external and internal hemorrhoids f/u in 10 years  Upper endoscopy due in 3 years from 09/29/17 due to barretts   2. Sarah Dennis  Also call Dr. Georga Bora OB/GYN and get copy of pap   [place]

## 2018-06-02 NOTE — Telephone Encounter (Signed)
Left message to return call back. [facility] needs to forward phone call back to me.

## 2018-07-20 ENCOUNTER — Telehealth: Payer: Self-pay

## 2018-07-20 NOTE — Telephone Encounter (Signed)
Please advise if patient can come in for nurse visit to do weight check for insurance purposes.

## 2018-07-20 NOTE — Telephone Encounter (Signed)
IF pcp is ok with this.

## 2018-07-20 NOTE — Telephone Encounter (Signed)
Copied from [place] [id number]. Topic: General - Other >> Jul 20, 2018 10:35 AM Lennox Solders wrote: Reason for CRM: pt would like to come in for weight check only for insurance purpose

## 2018-07-20 NOTE — Telephone Encounter (Signed)
Will Dr. Aundra Dubin be okay with this

## 2018-07-21 ENCOUNTER — Encounter: Payer: Self-pay | Admitting: Internal Medicine

## 2018-07-22 ENCOUNTER — Ambulatory Visit (INDEPENDENT_AMBULATORY_CARE_PROVIDER_SITE_OTHER): Payer: No Typology Code available for payment source

## 2018-07-22 VITALS — Ht 68.5 in | Wt 171.4 lb

## 2018-07-22 DIAGNOSIS — E663 Overweight: Secondary | ICD-10-CM

## 2018-07-22 NOTE — Progress Notes (Signed)
BMI 25.6  Inform pt is there anything else I need to do?   [place]

## 2018-07-22 NOTE — Progress Notes (Addendum)
Patient comes in for weight check for insurance purposes  . Weight in office today is 171lbs 6oz.    Patients weight at home this morning was 169lbs.  Patients weight is down 11lbs from office visit on 06/01/18.  At May office visit her weight was 198lbs.    Patient took Phentermine for 3 months , however she has stopped.   She has been eating smaller portions and exercising 3 times a week.

## 2018-07-23 NOTE — Progress Notes (Signed)
Patient aware  , copy of vitals given to patient for insurance purposes.

## 2018-07-27 ENCOUNTER — Other Ambulatory Visit (INDEPENDENT_AMBULATORY_CARE_PROVIDER_SITE_OTHER): Payer: No Typology Code available for payment source

## 2018-07-27 DIAGNOSIS — Z0000 Encounter for general adult medical examination without abnormal findings: Secondary | ICD-10-CM | POA: Diagnosis not present

## 2018-07-27 DIAGNOSIS — E785 Hyperlipidemia, unspecified: Secondary | ICD-10-CM | POA: Diagnosis not present

## 2018-07-27 DIAGNOSIS — Z1329 Encounter for screening for other suspected endocrine disorder: Secondary | ICD-10-CM | POA: Diagnosis not present

## 2018-07-27 DIAGNOSIS — Z8349 Family history of other endocrine, nutritional and metabolic diseases: Secondary | ICD-10-CM | POA: Diagnosis not present

## 2018-07-27 LAB — CBC WITH DIFFERENTIAL/PLATELET
Basophils Absolute: 0 10*3/uL (ref 0.0–0.1)
Basophils Relative: 0.8 % (ref 0.0–3.0)
Eosinophils Absolute: 0.1 10*3/uL (ref 0.0–0.7)
Eosinophils Relative: 1.8 % (ref 0.0–5.0)
HEMATOCRIT: 41.8 % (ref 36.0–46.0)
HEMOGLOBIN: 14.5 g/dL (ref 12.0–15.0)
LYMPHS ABS: 1.9 10*3/uL (ref 0.7–4.0)
Lymphocytes Relative: 32.2 % (ref 12.0–46.0)
MCHC: 34.7 g/dL (ref 30.0–36.0)
MCV: 88.7 fl (ref 78.0–100.0)
Monocytes Absolute: 0.4 10*3/uL (ref 0.1–1.0)
Monocytes Relative: 7 % (ref 3.0–12.0)
NEUTROS ABS: 3.4 10*3/uL (ref 1.4–7.7)
Neutrophils Relative %: 58.2 % (ref 43.0–77.0)
Platelets: 205 10*3/uL (ref 150.0–400.0)
RBC: 4.72 Mil/uL (ref 3.87–5.11)
RDW: 13.7 % (ref 11.5–15.5)
WBC: 5.9 10*3/uL (ref 4.0–10.5)

## 2018-07-27 LAB — LIPID PANEL
CHOL/HDL RATIO: 4
Cholesterol: 225 mg/dL — ABNORMAL HIGH (ref 0–200)
HDL: 55.1 mg/dL (ref >39.00)
LDL CALC: 148 mg/dL — AB (ref 0–99)
NonHDL: 169.97
TRIGLYCERIDES: 109 mg/dL (ref 0.0–149.0)
VLDL: 21.8 mg/dL (ref 0.0–40.0)

## 2018-07-27 LAB — COMPREHENSIVE METABOLIC PANEL
ALBUMIN: 4.5 g/dL (ref 3.5–5.2)
ALT: 10 U/L (ref 0–35)
AST: 13 U/L (ref 0–37)
Alkaline Phosphatase: 36 U/L — ABNORMAL LOW (ref 39–117)
BUN: 12 mg/dL (ref 6–23)
CHLORIDE: 104 meq/L (ref 96–112)
CO2: 26 mEq/L (ref 19–32)
CREATININE: 0.94 mg/dL (ref 0.40–1.20)
Calcium: 9.7 mg/dL (ref 8.4–10.5)
GFR: 65.71 mL/min (ref >60.00)
Glucose, Bld: 119 mg/dL — ABNORMAL HIGH (ref 70–99)
POTASSIUM: 4.3 meq/L (ref 3.5–5.1)
Sodium: 141 mEq/L (ref 135–145)
Total Bilirubin: 0.6 mg/dL (ref 0.2–1.2)
Total Protein: 7.5 g/dL (ref 6.0–8.3)

## 2018-07-27 LAB — T4, FREE: Free T4: 0.93 ng/dL (ref 0.60–1.60)

## 2018-07-27 LAB — TSH: TSH: 2.43 u[IU]/mL (ref 0.35–4.50)

## 2018-08-03 ENCOUNTER — Other Ambulatory Visit: Payer: Self-pay | Admitting: Internal Medicine

## 2018-08-03 ENCOUNTER — Other Ambulatory Visit: Payer: Self-pay

## 2018-08-03 ENCOUNTER — Encounter: Payer: Self-pay | Admitting: Internal Medicine

## 2018-08-03 DIAGNOSIS — K22719 Barrett's esophagus with dysplasia, unspecified: Secondary | ICD-10-CM

## 2018-08-03 MED ORDER — PANTOPRAZOLE SODIUM 40 MG PO TBEC: 40.0000 mg | DELAYED_RELEASE_TABLET | Freq: Every day | ORAL | 1 refills | Status: DC

## 2018-08-03 NOTE — Telephone Encounter (Signed)
Pt calling in.  States that because she has been taking this BID instead of BD she will not be able to get this medication for another few months.  Pt wants to know if she should just not take this medication until she can get another refill.   Pt states that Dr. Vira Agar will not refill because she doesn't see him on a yearly basis. Pt can be reached at [phone]

## 2018-08-31 ENCOUNTER — Ambulatory Visit: Payer: Self-pay | Admitting: Internal Medicine

## 2018-09-15 ENCOUNTER — Ambulatory Visit
Admission: RE | Admit: 2018-09-15 | Discharge: 2018-09-15 | Disposition: A | Discharge status: Home / Self Care | Payer: No Typology Code available for payment source | Source: Ambulatory Visit | Attending: Internal Medicine | Admitting: Internal Medicine

## 2018-09-15 DIAGNOSIS — Z1231 Encounter for screening mammogram for malignant neoplasm of breast: Secondary | ICD-10-CM | POA: Diagnosis present

## 2018-09-30 ENCOUNTER — Ambulatory Visit (INDEPENDENT_AMBULATORY_CARE_PROVIDER_SITE_OTHER): Payer: No Typology Code available for payment source | Admitting: Internal Medicine

## 2018-09-30 ENCOUNTER — Encounter: Payer: Self-pay | Admitting: Internal Medicine

## 2018-09-30 VITALS — BP 110/80 | HR 72 | Temp 98.3°F | Resp 15 | Ht 68.5 in | Wt 172.2 lb

## 2018-09-30 DIAGNOSIS — R739 Hyperglycemia, unspecified: Secondary | ICD-10-CM | POA: Diagnosis not present

## 2018-09-30 DIAGNOSIS — E663 Overweight: Secondary | ICD-10-CM | POA: Diagnosis not present

## 2018-09-30 DIAGNOSIS — E785 Hyperlipidemia, unspecified: Secondary | ICD-10-CM

## 2018-09-30 LAB — POCT GLYCOSYLATED HEMOGLOBIN (HGB A1C): HEMOGLOBIN A1C: 4.9 % (ref 4.0–5.6)

## 2018-09-30 MED ORDER — PHENTERMINE HCL 15 MG PO TBDP: 1.0000 | ORAL_TABLET | ORAL | 0 refills | Status: DC

## 2018-09-30 NOTE — Progress Notes (Signed)
Chief Complaint  Patient presents with  . Follow-up   F/u  1. HLD improved changed diet and exercising  2. Hyperglycemia A1C 4.9 today poc  3. Overweight with weight loss on 1/2 dose of adipex p 37.5 b/c she could not sleep doing well and happy with results    Review of Systems  Constitutional: Positive for weight loss.  HENT: Negative for hearing loss.   Eyes: Negative for blurred vision.  Respiratory: Negative for shortness of breath.   Cardiovascular: Negative for chest pain.  Skin: Negative for rash.  Neurological: Negative for headaches.  Psychiatric/Behavioral: Negative for depression.   Past Medical History:  Diagnosis Date  . Arthritis   . Barrett's esophagus   . Carpal tunnel syndrome 01/08/2016   Right  . Esophagitis   . Esophagitis    eosinophillic   . GERD (gastroesophageal reflux disease)   . Hyperlipidemia   . Shingles    left eye x 2    Past Surgical History:  Procedure Laterality Date  . CESAREAN SECTION  1988  . COLONOSCOPY  07/2013   Dr. Vira Agar  . mole removal  15 yaers ago  . [place]     2007  . TUBAL LIGATION  2000  . VEIN SURGERY  2007,2012   Family History  Problem Relation Age of Onset  . Cancer Mother        pancreatitic cancer   . Hypothyroidism Mother   . Goiter Mother   . Depression Father   . Dementia Father   . Bipolar disorder Father   . Hypothyroidism Father   . Goiter Father   . Depression Sister   . Hyperlipidemia Sister   . Hyperlipidemia Brother   . Hypothyroidism Brother   . Heart disease Paternal Grandfather        MI  . Breast cancer Neg Hx    Social History   Socioeconomic History  . Marital status: Married    Spouse name: Not on file  . Number of children: 2  . Years of education: Not on file  . Highest education level: Not on file  Occupational History  . Not on file  Social Needs  . Financial resource strain: Not on file  . Food insecurity:    Worry: Not on file    Inability: Not on file   . Transportation needs:    Medical: Not on file    Non-medical: Not on file  Tobacco Use  . Smoking status: Never Smoker  . Smokeless tobacco: Never Used  Substance and Sexual Activity  . Alcohol use: Yes    Comment: occasionally  . Drug use: No  . Sexual activity: Yes    Comment: men  Lifestyle  . Physical activity:    Days per week: Not on file    Minutes per session: Not on file  . Stress: Not on file  Relationships  . Social connections:    Talks on phone: Not on file    Gets together: Not on file    Attends religious service: Not on file    Active member of club or organization: Not on file    Attends meetings of clubs or organizations: Not on file    Relationship status: Not on file  . Intimate partner violence:    Fear of current or ex partner: Not on file    Emotionally abused: Not on file    Physically abused: Not on file    Forced sexual activity: Not on file  Other Topics Concern  . 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 doxylamine, Sleep, (UNISOM) 25 MG tablet Take 25 mg by mouth at bedtime.  Marland Kitchen estradiol (ESTRING) 2 MG vaginal ring Place 0.05 mg vaginally every 3 (three) months. follow package directions  . glucosamine-chondroitin 500-400 MG tablet Take 1 tablet by mouth 3 (three) times daily.  Marland Kitchen MAGNESIUM PO Take 500 mg by mouth 2 (two) times daily.  . metoCLOPramide (REGLAN) 10 MG tablet Take 1 tablet (10 mg total) by mouth daily as needed for nausea.  . NON FORMULARY   . pantoprazole (PROTONIX) 40 MG tablet Take 1 tablet (40 mg total) by mouth daily. Before food 30 minutes   Allergies  Allergen Reactions  . Corn Starch Nausea And Vomiting  . Gluten Meal Nausea And Vomiting  . Percocet [Oxycodone-Acetaminophen] Itching   Recent Results (from the past 2160 hour(s))  T4, free     Status: None   Collection  Time: 07/27/18  8:12 AM  Result Value Ref Range   Free T4 0.93 0.60 - 1.60 ng/dL    Comment: Specimens from patients who are undergoing biotin therapy and /or ingesting biotin supplements may contain high levels of biotin.  The higher biotin concentration in these specimens interferes with this Free T4 assay.  Specimens that contain high levels  of biotin may cause false high results for this Free T4 assay.  Please interpret results in light of the total clinical presentation of the patient.    TSH     Status: None   Collection Time: 07/27/18  8:12 AM  Result Value Ref Range   TSH 2.43 0.35 - 4.50 uIU/mL  Lipid panel     Status: Abnormal   Collection Time: 07/27/18  8:12 AM  Result Value Ref Range   Cholesterol 225 (H) 0 - 200 mg/dL    Comment: ATP III Classification       Desirable:  < 200 mg/dL               Borderline High:  200 - 239 mg/dL          High:  > = 240 mg/dL   Triglycerides 109.0 0.0 - 149.0 mg/dL    Comment: Normal:  <150 mg/dLBorderline High:  150 - 199 mg/dL   HDL 55.10 >39.00 mg/dL   VLDL 21.8 0.0 - 40.0 mg/dL   LDL Cholesterol 148 (H) 0 - 99 mg/dL   Total CHOL/HDL Ratio 4     Comment:                Men          Women1/2 Average Risk     3.4          3.3Average Risk          5.0          4.42X Average Risk          9.6          7.13X Average Risk          15.0          11.0                       NonHDL  169.97     Comment: NOTE:  Non-HDL goal should be 30 mg/dL higher than patient's LDL goal (i.e. LDL goal of < 70 mg/dL, would have non-HDL goal of < 100 mg/dL)  CBC with Differential/Platelet     Status: None   Collection Time: 07/27/18  8:12 AM  Result Value Ref Range   WBC 5.9 4.0 - 10.5 K/uL   RBC 4.72 3.87 - 5.11 Mil/uL   Hemoglobin 14.5 12.0 - 15.0 g/dL   HCT 41.8 36.0 - 46.0 %   MCV 88.7 78.0 - 100.0 fl   MCHC 34.7 30.0 - 36.0 g/dL   RDW 13.7 11.5 - 15.5 %   Platelets 205.0 150.0 - 400.0 K/uL   Neutrophils Relative % 58.2 43.0 - 77.0 %   Lymphocytes  Relative 32.2 12.0 - 46.0 %   Monocytes Relative 7.0 3.0 - 12.0 %   Eosinophils Relative 1.8 0.0 - 5.0 %   Basophils Relative 0.8 0.0 - 3.0 %   Neutro Abs 3.4 1.4 - 7.7 K/uL   Lymphs Abs 1.9 0.7 - 4.0 K/uL   Monocytes Absolute 0.4 0.1 - 1.0 K/uL   Eosinophils Absolute 0.1 0.0 - 0.7 K/uL   Basophils Absolute 0.0 0.0 - 0.1 K/uL  Comprehensive metabolic panel     Status: Abnormal   Collection Time: 07/27/18  8:12 AM  Result Value Ref Range   Sodium 141 135 - 145 mEq/L   Potassium 4.3 3.5 - 5.1 mEq/L   Chloride 104 96 - 112 mEq/L   CO2 26 19 - 32 mEq/L   Glucose, Bld 119 (H) 70 - 99 mg/dL   BUN 12 6 - 23 mg/dL   Creatinine, Ser 0.94 0.40 - 1.20 mg/dL   Total Bilirubin 0.6 0.2 - 1.2 mg/dL   Alkaline Phosphatase 36 (L) 39 - 117 U/L   AST 13 0 - 37 U/L   ALT 10 0 - 35 U/L   Total Protein 7.5 6.0 - 8.3 g/dL   Albumin 4.5 3.5 - 5.2 g/dL   Calcium 9.7 8.4 - 10.5 mg/dL   GFR 65.71 >60.00 mL/min   Objective  Body mass index is 25.81 kg/m. Wt Readings from Last 3 Encounters:  09/30/18 172 lb 4 oz (78.1 kg)  07/22/18 171 lb 6 oz (77.7 kg)  06/01/18 182 lb 3.2 oz (82.6 kg)   Temp Readings from Last 3 Encounters:  09/30/18 98.3 F (36.8 C) (Oral)  06/01/18 98.2 F (36.8 C) (Oral)  05/04/18 98.6 F (37 C) (Oral)   BP Readings from Last 3 Encounters:  09/30/18 110/80  06/01/18 100/62  05/04/18 116/76   Pulse Readings from Last 3 Encounters:  09/30/18 72  06/01/18 86  05/04/18 81    Physical Exam  Constitutional: She is oriented to person, place, and time. Vital signs are normal. She appears well-developed and well-nourished. She is cooperative.  HENT:  Head: Normocephalic and atraumatic.  Mouth/Throat: Oropharynx is clear and moist and mucous membranes are normal.  Eyes: Pupils are equal, round, and reactive to light. Conjunctivae are normal.  Cardiovascular: Normal rate, regular rhythm and normal heart sounds.  Pulmonary/Chest: Effort normal and breath sounds normal.   Neurological: She is alert and oriented to person, place, and time. Gait normal.  Skin: Skin is warm, dry and intact.  Psychiatric: She has a normal mood and affect. Her speech is normal and behavior is normal. Judgment and thought content normal. Cognition and memory are normal.  Nursing note and vitals reviewed.   Assessment  1. HLD no indication for statin yet  2. Overweight >25  3. Hyperglycemia A1C 4.9 today  4. HM Plan   1. Diet and exercise given cholesterol info today  Repeat lipid  2. adipex reduce to 15 mg #60 no refills f/u in 6 months will take prn  3. See above  4.  Flu had shot 07/30/18  Tdaphad 03/03/10 confirmed with pt Had shingrix 1/2 08/18/18  Protected MMRtiter checked 03/31/08 immune, hep Bimmune titer 1028 3/19/1 no hep C or HIV quantiferon gold neg 03/31/08 Varicella immune 03/31/08  mammo neg 09/15/18  Still need to get pap from Dr. Cherylann Banas ob/gyn 2nd request  Colonoscopy [facility] Dr. Tiffany Kocher 08/23/13 ext/int hemorrhoids repeat in 10 years  EGD + barretts since 2014 last EGD 09/29/17 see above due 09/2020  sch fasting labs due 07/2018  rec healthy diet choices and exercise  Does not f/u dermatology Dr. Bary Castilla bx skin lesion or ln2   Provider: Dr. Olivia Mackie McLean-Scocuzza-Internal Medicine

## 2018-09-30 NOTE — Patient Instructions (Signed)

## 2018-10-15 ENCOUNTER — Encounter: Payer: Self-pay | Admitting: Internal Medicine

## 2019-03-02 MED FILL — ESTRING 2 MG VAGINAL RING: 2 | 90 days supply | Qty: 1 | Fill #0

## 2019-03-22 ENCOUNTER — Telehealth: Payer: Self-pay

## 2019-03-22 NOTE — Telephone Encounter (Signed)
Copied from [place] [id number]. Topic: General - Other >> Mar 22, 2019  9:58 AM Leward Quan A wrote: Reason for CRM: Patient would like a call back to cancel and reschedule her appointment for 03/31/2019 and to combine that appointment with her physical that she need to schedule. Please call patient at Ph# [phone]

## 2019-03-31 ENCOUNTER — Ambulatory Visit: Payer: Self-pay | Admitting: Internal Medicine

## 2019-05-06 IMAGING — MG DIGITAL SCREENING BILATERAL MAMMOGRAM WITH TOMO AND CAD
8 series · 9 of 24 positions shown · non-contrast
Comparison: Previous exam(s).

CLINICAL DATA: Screening.

EXAM:
DIGITAL SCREENING BILATERAL MAMMOGRAM WITH TOMO AND CAD

[L CC synth-2D]
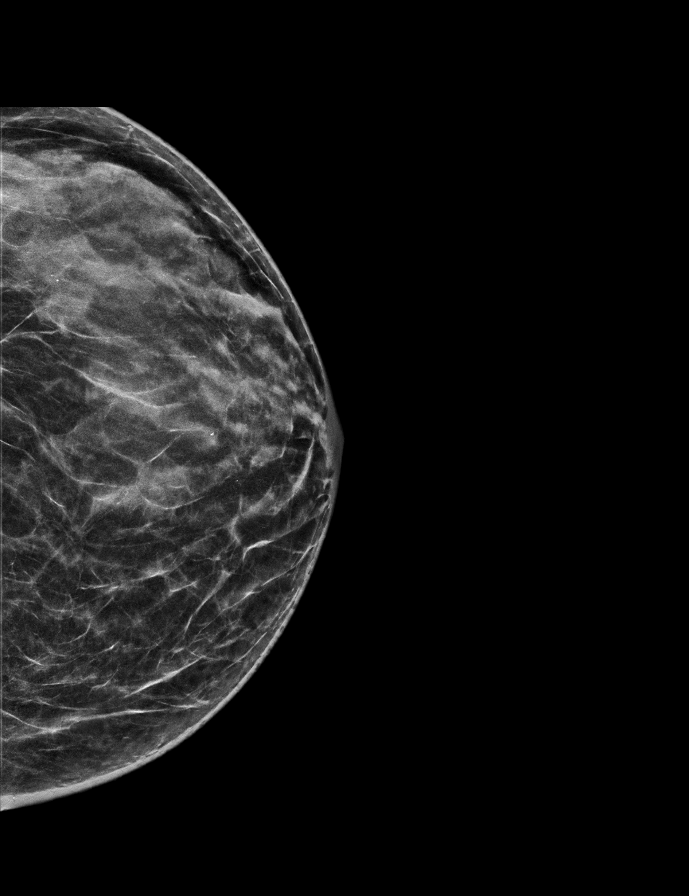

[R MLO synth-2D]
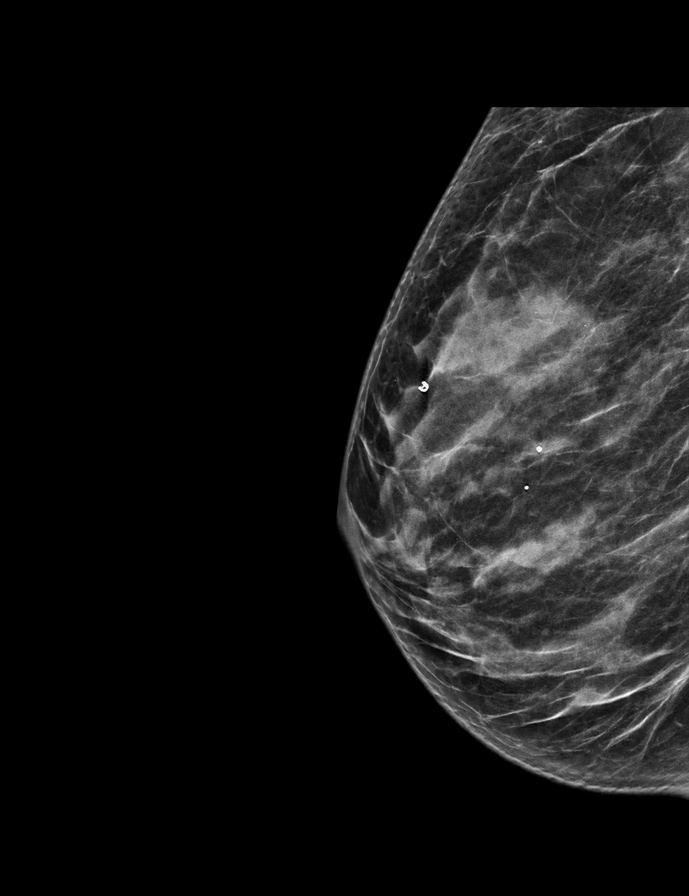

[R CC synth-2D]
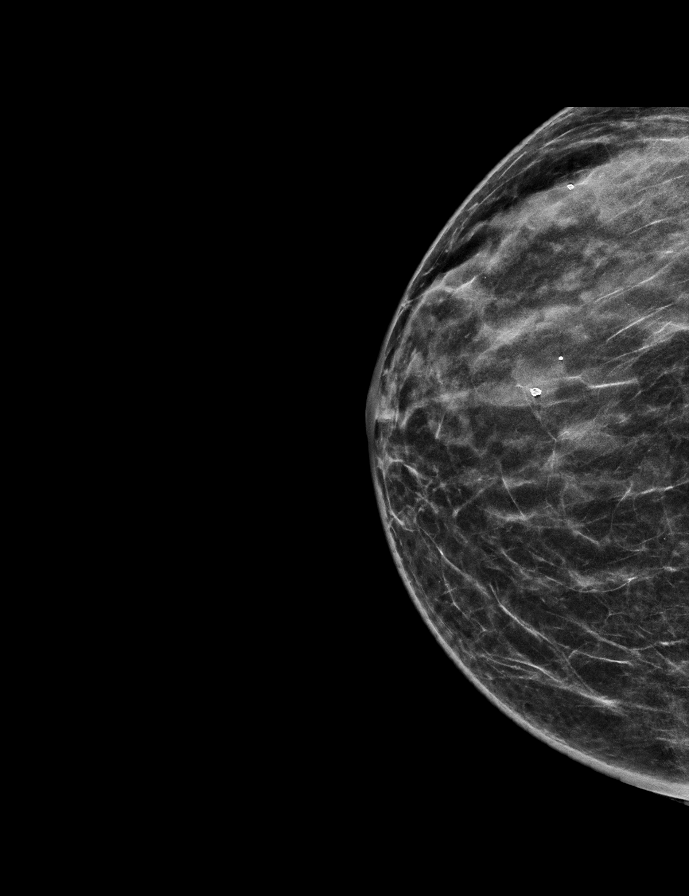

[L MLO synth-2D]
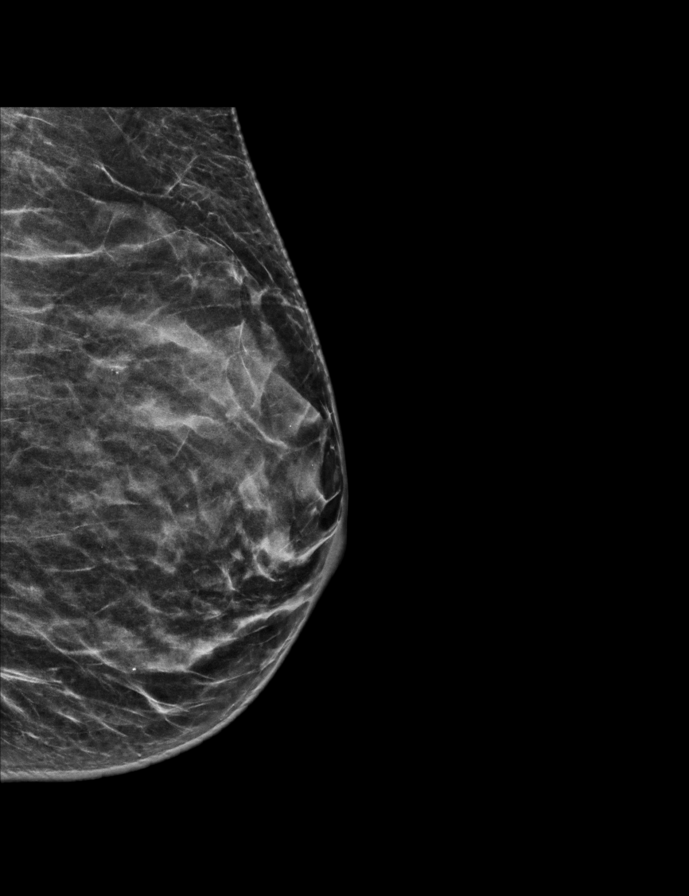

[L MLO tomo · 2 of 52 frames shown]
[frame 17/52]
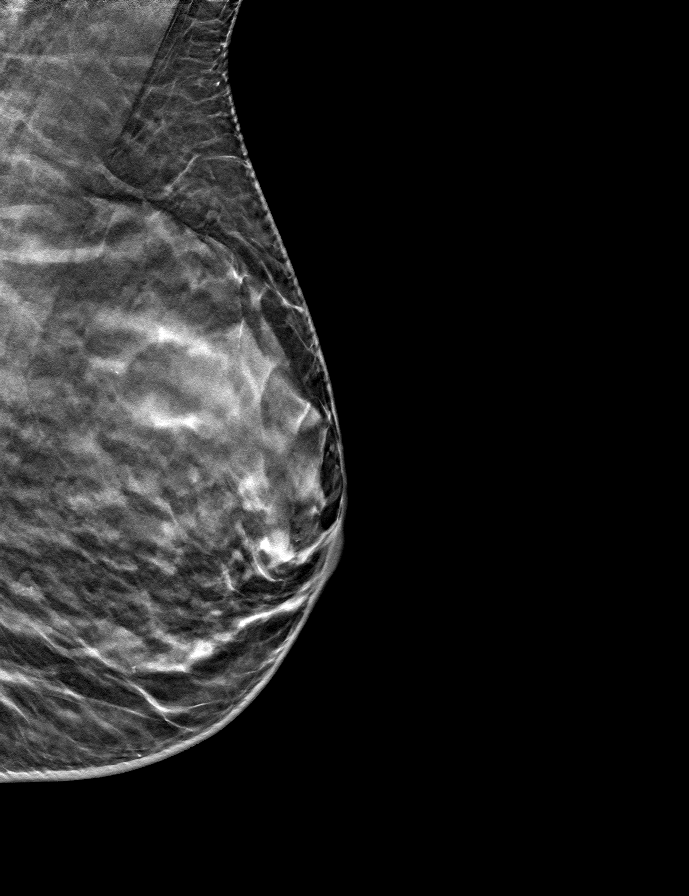
[frame 27/52]
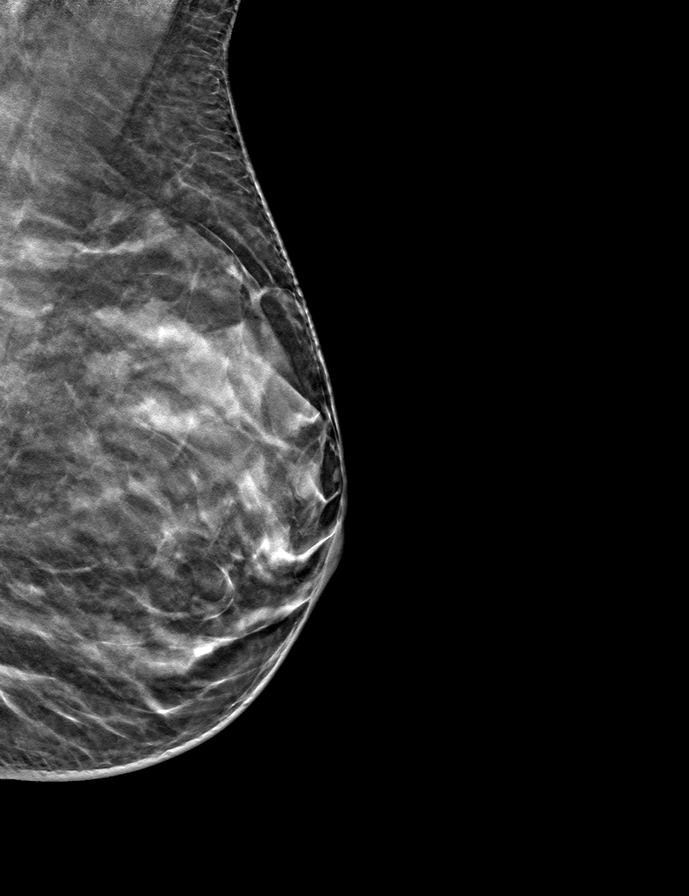

[L CC tomo · tomo slice 25/50.0]
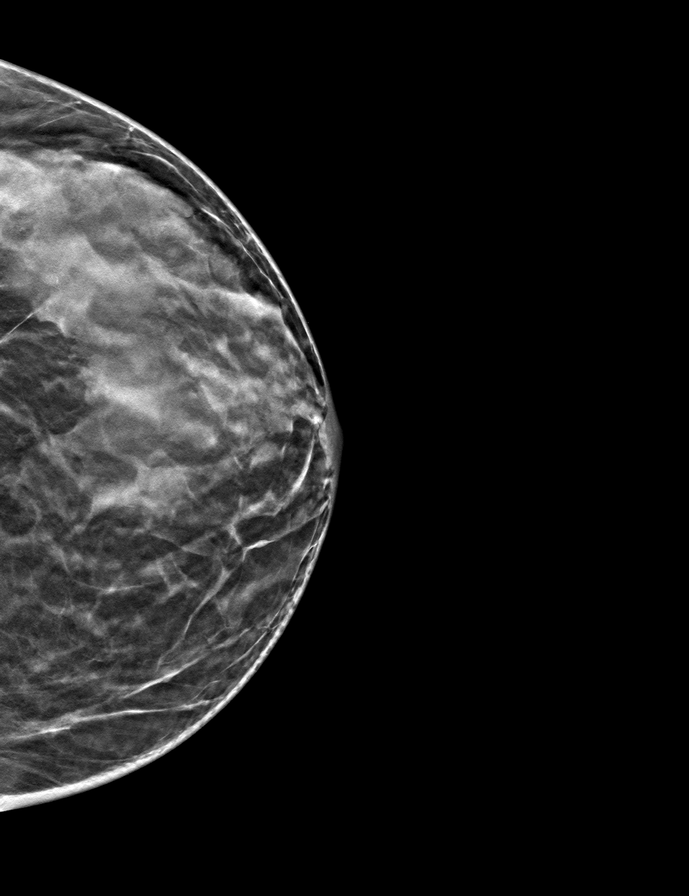

[R MLO tomo · tomo slice 29/58.0]
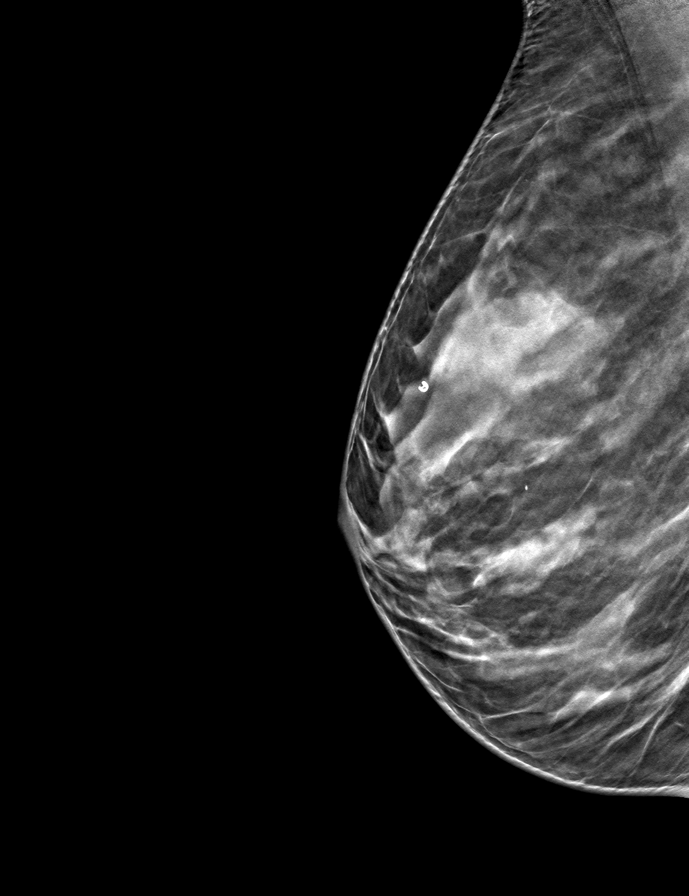

[R CC tomo · tomo slice 23/46.0]
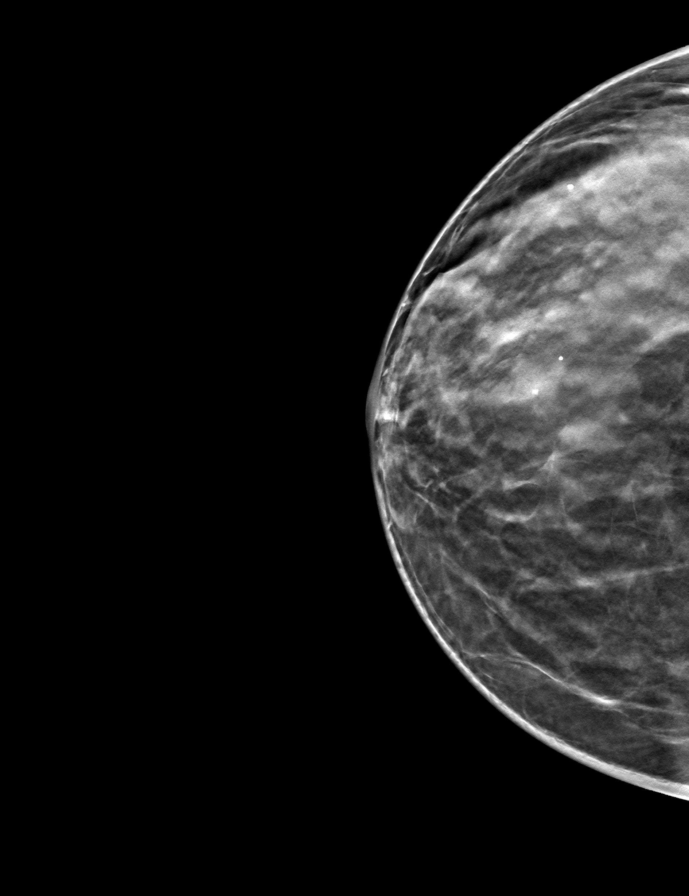

[9 of 24 positions shown; findings below may reference images not displayed]

ACR Breast Density Category c: The breast tissue is heterogeneously
dense, which may obscure small masses.
FINDINGS: There are no findings suspicious for malignancy. Images were
processed with CAD.
IMPRESSION: No mammographic evidence of malignancy. A result letter of this
screening mammogram will be mailed directly to the patient.

RECOMMENDATION:
Screening mammogram in one year. (Code:[id number])

BI-RADS CATEGORY  1: Negative.

## 2019-05-17 ENCOUNTER — Other Ambulatory Visit: Payer: Self-pay | Admitting: Internal Medicine

## 2019-05-17 DIAGNOSIS — K22719 Barrett's esophagus with dysplasia, unspecified: Secondary | ICD-10-CM

## 2019-05-17 MED ORDER — PANTOPRAZOLE SODIUM 40 MG PO TBEC: 40.0000 mg | DELAYED_RELEASE_TABLET | Freq: Every day | ORAL | 3 refills | Status: DC

## 2019-06-08 ENCOUNTER — Other Ambulatory Visit: Payer: Self-pay | Admitting: Internal Medicine

## 2019-06-08 DIAGNOSIS — Z780 Asymptomatic menopausal state: Secondary | ICD-10-CM

## 2019-06-08 MED ORDER — ESTRING 2 MG VA RING: 0.0500 mg | VAGINAL_RING | VAGINAL | 3 refills | Status: DC

## 2019-06-15 ENCOUNTER — Encounter: Payer: No Typology Code available for payment source | Admitting: Internal Medicine

## 2019-06-18 ENCOUNTER — Other Ambulatory Visit: Payer: Self-pay | Admitting: Internal Medicine

## 2019-06-18 DIAGNOSIS — E663 Overweight: Secondary | ICD-10-CM

## 2019-06-20 MED ORDER — PHENTERMINE HCL 15 MG PO TBDP: 1.0000 | ORAL_TABLET | ORAL | 0 refills | Status: DC

## 2019-07-21 ENCOUNTER — Encounter: Payer: No Typology Code available for payment source | Admitting: Internal Medicine

## 2019-08-09 ENCOUNTER — Other Ambulatory Visit: Payer: Self-pay

## 2019-08-10 ENCOUNTER — Other Ambulatory Visit: Payer: Self-pay

## 2019-08-10 ENCOUNTER — Ambulatory Visit (INDEPENDENT_AMBULATORY_CARE_PROVIDER_SITE_OTHER): Payer: No Typology Code available for payment source | Admitting: Internal Medicine

## 2019-08-10 ENCOUNTER — Encounter: Payer: Self-pay | Admitting: Internal Medicine

## 2019-08-10 VITALS — BP 122/70 | HR 72 | Temp 97.4°F | Ht 68.11 in | Wt 177.0 lb

## 2019-08-10 DIAGNOSIS — E663 Overweight: Secondary | ICD-10-CM

## 2019-08-10 DIAGNOSIS — Z13220 Encounter for screening for lipoid disorders: Secondary | ICD-10-CM

## 2019-08-10 DIAGNOSIS — Z1389 Encounter for screening for other disorder: Secondary | ICD-10-CM

## 2019-08-10 DIAGNOSIS — Z1231 Encounter for screening mammogram for malignant neoplasm of breast: Secondary | ICD-10-CM

## 2019-08-10 DIAGNOSIS — M545 Low back pain, unspecified: Secondary | ICD-10-CM

## 2019-08-10 DIAGNOSIS — G8929 Other chronic pain: Secondary | ICD-10-CM

## 2019-08-10 DIAGNOSIS — Z0000 Encounter for general adult medical examination without abnormal findings: Secondary | ICD-10-CM | POA: Diagnosis not present

## 2019-08-10 DIAGNOSIS — Z1329 Encounter for screening for other suspected endocrine disorder: Secondary | ICD-10-CM | POA: Diagnosis not present

## 2019-08-10 DIAGNOSIS — N952 Postmenopausal atrophic vaginitis: Secondary | ICD-10-CM

## 2019-08-10 DIAGNOSIS — E785 Hyperlipidemia, unspecified: Secondary | ICD-10-CM

## 2019-08-10 DIAGNOSIS — N941 Unspecified dyspareunia: Secondary | ICD-10-CM

## 2019-08-10 LAB — COMPREHENSIVE METABOLIC PANEL
ALT: 15 U/L (ref 0–35)
AST: 14 U/L (ref 0–37)
Albumin: 4.7 g/dL (ref 3.5–5.2)
Alkaline Phosphatase: 54 U/L (ref 39–117)
BUN: 18 mg/dL (ref 6–23)
CO2: 29 mEq/L (ref 19–32)
Calcium: 10.1 mg/dL (ref 8.4–10.5)
Chloride: 103 mEq/L (ref 96–112)
Creatinine, Ser: 0.87 mg/dL (ref 0.40–1.20)
GFR: 67.34 mL/min (ref >60.00)
Glucose, Bld: 92 mg/dL (ref 70–99)
Potassium: 4.9 mEq/L (ref 3.5–5.1)
Sodium: 141 mEq/L (ref 135–145)
Total Bilirubin: 0.7 mg/dL (ref 0.2–1.2)
Total Protein: 7.4 g/dL (ref 6.0–8.3)

## 2019-08-10 LAB — CBC WITH DIFFERENTIAL/PLATELET
Basophils Absolute: 0 10*3/uL (ref 0.0–0.1)
Basophils Relative: 0.6 % (ref 0.0–3.0)
Eosinophils Absolute: 0.1 10*3/uL (ref 0.0–0.7)
Eosinophils Relative: 1.4 % (ref 0.0–5.0)
HCT: 42 % (ref 36.0–46.0)
Hemoglobin: 14 g/dL (ref 12.0–15.0)
Lymphocytes Relative: 29.7 % (ref 12.0–46.0)
Lymphs Abs: 1.7 10*3/uL (ref 0.7–4.0)
MCHC: 33.2 g/dL (ref 30.0–36.0)
MCV: 89.8 fl (ref 78.0–100.0)
Monocytes Absolute: 0.4 10*3/uL (ref 0.1–1.0)
Monocytes Relative: 7.5 % (ref 3.0–12.0)
Neutro Abs: 3.5 10*3/uL (ref 1.4–7.7)
Neutrophils Relative %: 60.8 % (ref 43.0–77.0)
Platelets: 178 10*3/uL (ref 150.0–400.0)
RBC: 4.68 Mil/uL (ref 3.87–5.11)
RDW: 14.1 % (ref 11.5–15.5)
WBC: 5.7 10*3/uL (ref 4.0–10.5)

## 2019-08-10 LAB — LIPID PANEL
Cholesterol: 249 mg/dL — ABNORMAL HIGH (ref 0–200)
HDL: 68 mg/dL (ref >39.00)
LDL Cholesterol: 166 mg/dL — ABNORMAL HIGH (ref 0–99)
NonHDL: 181.26
Total CHOL/HDL Ratio: 4
Triglycerides: 77 mg/dL (ref 0.0–149.0)
VLDL: 15.4 mg/dL (ref 0.0–40.0)

## 2019-08-10 LAB — TSH: TSH: 0.97 u[IU]/mL (ref 0.35–4.50)

## 2019-08-10 MED ORDER — INTRAROSA 6.5 MG VA INST: 1.0000 "application " | VAGINAL_INSERT | Freq: Every day | VAGINAL | 0 refills | Status: DC

## 2019-08-10 MED ORDER — PHENTERMINE HCL 15 MG PO CAPS: 15.0000 mg | ORAL_CAPSULE | ORAL | 0 refills | Status: DC

## 2019-08-10 NOTE — Patient Instructions (Signed)
Cholesterol Content in Foods Cholesterol is a waxy, fat-like substance that helps to carry fat in the blood. The body needs cholesterol in small amounts, but too much cholesterol can cause damage to the arteries and heart. Most people should eat less than 200 milligrams (mg) of cholesterol a day. Foods with cholesterol  Cholesterol is found in animal-based foods, such as meat, seafood, and dairy. Generally, low-fat dairy and lean meats have less cholesterol than full-fat dairy and fatty meats. The milligrams of cholesterol per serving (mg per serving) of common cholesterol-containing foods are listed below. Meat and other proteins  Egg - one large whole egg has 186 mg.  Veal shank - 4 oz has 141 mg.  Lean ground turkey (93% lean) - 4 oz has 118 mg.  Fat-trimmed lamb loin - 4 oz has 106 mg.  Lean ground beef (90% lean) - 4 oz has 100 mg.  Lobster - 3.5 oz has 90 mg.  Pork loin chops - 4 oz has 86 mg.  Canned salmon - 3.5 oz has 83 mg.  Fat-trimmed beef top loin - 4 oz has 78 mg.  Frankfurter - 1 frank (3.5 oz) has 77 mg.  Crab - 3.5 oz has 71 mg.  Roasted chicken without skin, white meat - 4 oz has 66 mg.  Light bologna - 2 oz has 45 mg.  Deli-cut turkey - 2 oz has 31 mg.  Canned tuna - 3.5 oz has 31 mg.  Bacon - 1 oz has 29 mg.  Oysters and mussels (raw) - 3.5 oz has 25 mg.  Mackerel - 1 oz has 22 mg.  Trout - 1 oz has 20 mg.  Pork sausage - 1 link (1 oz) has 17 mg.  Salmon - 1 oz has 16 mg.  Tilapia - 1 oz has 14 mg. Dairy  Soft-serve ice cream -  cup (4 oz) has 103 mg.  Whole-milk yogurt - 1 cup (8 oz) has 29 mg.  Cheddar cheese - 1 oz has 28 mg.  American cheese - 1 oz has 28 mg.  Whole milk - 1 cup (8 oz) has 23 mg.  2% milk - 1 cup (8 oz) has 18 mg.  Cream cheese - 1 tablespoon (Tbsp) has 15 mg.  Cottage cheese -  cup (4 oz) has 14 mg.  Low-fat (1%) milk - 1 cup (8 oz) has 10 mg.  Sour cream - 1 Tbsp has 8.5 mg.  Low-fat yogurt - 1 cup  (8 oz) has 8 mg.  Nonfat Greek yogurt - 1 cup (8 oz) has 7 mg.  Half-and-half cream - 1 Tbsp has 5 mg. Fats and oils  Cod liver oil - 1 tablespoon (Tbsp) has 82 mg.  Butter - 1 Tbsp has 15 mg.  Lard - 1 Tbsp has 14 mg.  Bacon grease - 1 Tbsp has 14 mg.  Mayonnaise - 1 Tbsp has 5-10 mg.  Margarine - 1 Tbsp has 3-10 mg. Exact amounts of cholesterol in these foods may vary depending on specific ingredients and brands. Foods without cholesterol Most plant-based foods do not have cholesterol unless you combine them with a food that has cholesterol. Foods without cholesterol include:  Grains and cereals.  Vegetables.  Fruits.  Vegetable oils, such as olive, canola, and sunflower oil.  Legumes, such as peas, beans, and lentils.  Nuts and seeds.  Egg whites. Summary  The body needs cholesterol in small amounts, but too much cholesterol can cause damage to the arteries and heart.    Most people should eat less than 200 milligrams (mg) of cholesterol a day. This information is not intended to replace advice given to you by your health care provider. Make sure you discuss any questions you have with your health care provider. Document Released: 07/07/2017 Document Revised: 10/23/2017 Document Reviewed: 07/07/2017 Elsevier Patient Education  2020 Elsevier Inc.  

## 2019-08-15 DIAGNOSIS — N952 Postmenopausal atrophic vaginitis: Secondary | ICD-10-CM | POA: Insufficient documentation

## 2019-08-15 NOTE — Progress Notes (Addendum)
Chief Complaint  Patient presents with  . Annual Exam   Annual doing well  1. C/o vaginal dryness and spotting after sex  2. Overweight wants refill of adipex 15 mg to coninue  3. MSK pain she was seeing Dr. August Albino [address] which is new  4. HLD will repeat lipid panel today   Review of Systems  Constitutional: Negative for weight loss.  HENT: Negative for hearing loss.   Eyes: Negative for blurred vision.  Respiratory: Negative for shortness of breath.   Cardiovascular: Negative for chest pain.  Gastrointestinal: Negative for abdominal pain.  Genitourinary:       +pain with sex and vaginal spotting   Skin: Negative for rash.  Neurological: Negative for headaches.  Psychiatric/Behavioral: Negative for depression.   Past Medical History:  Diagnosis Date  . Arthritis   . Barrett's esophagus   . Carpal tunnel syndrome 01/08/2016   Right  . Esophagitis   . Esophagitis    eosinophillic   . GERD (gastroesophageal reflux disease)   . Hyperlipidemia   . Shingles    left eye x 2    Past Surgical History:  Procedure Laterality Date  . CESAREAN SECTION  1988  . COLONOSCOPY  07/2013   Dr. Vira Agar  . mole removal  15 yaers ago  . [place]     2007  . TUBAL LIGATION  2000  . VEIN SURGERY  2007,2012   Family History  Problem Relation Age of Onset  . Cancer Mother        pancreatitic cancer   . Hypothyroidism Mother   . Goiter Mother   . Depression Father   . Dementia Father   . Bipolar disorder Father   . Hypothyroidism Father   . Goiter Father   . Depression Sister   . Hyperlipidemia Sister   . Hyperlipidemia Brother   . Hypothyroidism Brother   . Heart disease Paternal Grandfather        MI  . Breast cancer Neg Hx    Social History   Socioeconomic History  . Marital status: Married    Spouse name: Not on file  . Number of children: 2  . Years of education: Not on file  . Highest education level: Not on file  Occupational History  . Not on  file  Social Needs  . Financial resource strain: Not on file  . Food insecurity    Worry: Not on file    Inability: Not on file  . Transportation needs    Medical: Not on file    Non-medical: Not on file  Tobacco Use  . Smoking status: Never Smoker  . Smokeless tobacco: Never Used  Substance and Sexual Activity  . Alcohol use: Yes    Comment: occasionally  . Drug use: No  . Sexual activity: Yes    Comment: men  Lifestyle  . Physical activity    Days per week: Not on file    Minutes per session: Not on file  . Stress: Not on file  Relationships  . Social Herbalist on phone: Not on file    Gets together: Not on file    Attends religious service: Not on file    Active member of club or organization: Not on file    Attends meetings of clubs or organizations: Not on file    Relationship status: Not on file  . Intimate partner violence    Fear of current or ex partner: Not  on file    Emotionally abused: Not on file    Physically abused: Not on file    Forced sexual activity: Not on file  Other Topics Concern  . 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 doxylamine, Sleep, (UNISOM) 25 MG tablet Take 25 mg by mouth at bedtime.  Marland Kitchen estradiol (ESTRING) 2 MG vaginal ring Place 0.05 mg vaginally every 3 (three) months. follow package directions  . MAGNESIUM PO Take 500 mg by mouth 2 (two) times daily.  . metoCLOPramide (REGLAN) 10 MG tablet Take 1 tablet (10 mg total) by mouth daily as needed for nausea.  . NON FORMULARY   . pantoprazole (PROTONIX) 40 MG tablet Take 1 tablet (40 mg total) by mouth daily. Before food 30 minutes  . [DISCONTINUED] Phentermine HCl 15 MG TBDP Take 1 tablet by mouth every morning. Before breakfast   Allergies  Allergen Reactions  . Corn Starch Nausea And Vomiting  . Gluten Meal Nausea And Vomiting   . Percocet [Oxycodone-Acetaminophen] Itching   Recent Results (from the past 2160 hour(s))  TSH     Status: None   Collection Time: 08/10/19  1:42 PM  Result Value Ref Range   TSH 0.97 0.35 - 4.50 uIU/mL  Lipid panel     Status: Abnormal   Collection Time: 08/10/19  1:42 PM  Result Value Ref Range   Cholesterol 249 (H) 0 - 200 mg/dL    Comment: ATP III Classification       Desirable:  < 200 mg/dL               Borderline High:  200 - 239 mg/dL          High:  > = 240 mg/dL   Triglycerides 77.0 0.0 - 149.0 mg/dL    Comment: Normal:  <150 mg/dLBorderline High:  150 - 199 mg/dL   HDL 68.00 >39.00 mg/dL   VLDL 15.4 0.0 - 40.0 mg/dL   LDL Cholesterol 166 (H) 0 - 99 mg/dL   Total CHOL/HDL Ratio 4     Comment:                Men          Women1/2 Average Risk     3.4          3.3Average Risk          5.0          4.42X Average Risk          9.6          7.13X Average Risk          15.0          11.0                       NonHDL 181.26     Comment: NOTE:  Non-HDL goal should be 30 mg/dL higher than patient's LDL goal (i.e. LDL goal of < 70 mg/dL, would have non-HDL goal of < 100 mg/dL)  CBC with Differential/Platelet     Status: None   Collection Time: 08/10/19  1:42 PM  Result Value Ref Range   WBC 5.7 4.0 - 10.5 K/uL   RBC 4.68 3.87 - 5.11 Mil/uL  Hemoglobin 14.0 12.0 - 15.0 g/dL   HCT 42.0 36.0 - 46.0 %   MCV 89.8 78.0 - 100.0 fl   MCHC 33.2 30.0 - 36.0 g/dL   RDW 14.1 11.5 - 15.5 %   Platelets 178.0 150.0 - 400.0 K/uL   Neutrophils Relative % 60.8 43.0 - 77.0 %   Lymphocytes Relative 29.7 12.0 - 46.0 %   Monocytes Relative 7.5 3.0 - 12.0 %   Eosinophils Relative 1.4 0.0 - 5.0 %   Basophils Relative 0.6 0.0 - 3.0 %   Neutro Abs 3.5 1.4 - 7.7 K/uL   Lymphs Abs 1.7 0.7 - 4.0 K/uL   Monocytes Absolute 0.4 0.1 - 1.0 K/uL   Eosinophils Absolute 0.1 0.0 - 0.7 K/uL   Basophils Absolute 0.0 0.0 - 0.1 K/uL  Comprehensive metabolic panel     Status: None   Collection Time: 08/10/19   1:42 PM  Result Value Ref Range   Sodium 141 135 - 145 mEq/L   Potassium 4.9 3.5 - 5.1 mEq/L   Chloride 103 96 - 112 mEq/L   CO2 29 19 - 32 mEq/L   Glucose, Bld 92 70 - 99 mg/dL   BUN 18 6 - 23 mg/dL   Creatinine, Ser 0.87 0.40 - 1.20 mg/dL   Total Bilirubin 0.7 0.2 - 1.2 mg/dL   Alkaline Phosphatase 54 39 - 117 U/L   AST 14 0 - 37 U/L   ALT 15 0 - 35 U/L   Total Protein 7.4 6.0 - 8.3 g/dL   Albumin 4.7 3.5 - 5.2 g/dL   Calcium 10.1 8.4 - 10.5 mg/dL   GFR 67.34 >60.00 mL/min   Objective  Body mass index is 26.83 kg/m. Wt Readings from Last 3 Encounters:  08/10/19 177 lb (80.3 kg)  09/30/18 172 lb 4 oz (78.1 kg)  07/22/18 171 lb 6 oz (77.7 kg)   Temp Readings from Last 3 Encounters:  08/10/19 (!) 97.4 F (36.3 C) (Temporal)  09/30/18 98.3 F (36.8 C) (Oral)  06/01/18 98.2 F (36.8 C) (Oral)   BP Readings from Last 3 Encounters:  08/10/19 122/70  09/30/18 110/80  06/01/18 100/62   Pulse Readings from Last 3 Encounters:  08/10/19 72  09/30/18 72  06/01/18 86    Physical Exam Vitals signs and nursing note reviewed.  Constitutional:      Appearance: Normal appearance. She is well-developed and well-groomed.  HENT:     Head: Normocephalic and atraumatic.     Comments: +mask on   Eyes:     Conjunctiva/sclera: Conjunctivae normal.  Cardiovascular:     Rate and Rhythm: Normal rate and regular rhythm.     Heart sounds: Normal heart sounds. No murmur.  Pulmonary:     Effort: Pulmonary effort is normal.     Breath sounds: Normal breath sounds.  Chest:     Breasts: Breasts are symmetrical.        Right: Normal. No swelling, bleeding, inverted nipple, mass, nipple discharge, skin change or tenderness.        Left: Normal. No swelling, bleeding, inverted nipple, mass, nipple discharge, skin change or tenderness.  Skin:    General: Skin is warm and dry.  Neurological:     General: No focal deficit present.     Mental Status: She is alert and oriented to person,  place, and time. Mental status is at baseline.     Gait: Gait normal.  Psychiatric:        Attention and Perception: Attention  and perception normal.        Mood and Affect: Mood and affect normal.        Speech: Speech normal.        Behavior: Behavior normal. Behavior is cooperative.        Thought Content: Thought content normal.        Cognition and Memory: Cognition and memory normal.        Judgment: Judgment normal.     Assessment  Plan  Annual physical exam - Plan: Fasting labs today  Flu had shot 07/30/18 will get with work  [place] 03/03/10 confirmed with pt Had shingrix 2/2 08/18/18  Protected MMRtiter checked 03/31/08 immune, hep Bimmune titer 1028 3/19/1 no hep C or HIV quantiferon gold neg 03/31/08 Varicella immune 03/31/08  mammo neg 09/15/18 call to schedule   07/23/17 pap from Dr. Cherylann Banas ob/gyn normal  -pt wants further paps from PCP due in 07/23/2020   DEXA 01/12/14 normal   [facility] ext/int hemorrhoids repeat in 10 years EGD + barretts since 2014 last EGD 11/6/18see above due 09/2020    rec healthy diet choices and exercise  Does not f/u dermatologyDr. Bary Castilla bx skin lesion or ln2     Vaginal atrophy - Plan: Prasterone (INTRAROSA) 6.5 MG INST  Hold estring for now and rec she use otc ky jelly with sex if continues will need to f/u ob/gyn to consider TVUS/pelvic US   Dyspareunia, female - Plan: Prasterone (INTRAROSA) 6.5 MG INST See above   Overweight (BMI 25.0-29.9) - Plan: phentermine 15 MG capsule x 2 more months then will need 3 month break   Hyperlipidemia but improved -rec healthy diet and exercise    Low back pain  F/u Dr. Emmit Pomfret chiropractor  12/27/19 xray   FINDINGS: Five lumbar type vertebral bodies are well visualized. Vertebral body height is well maintained. No pars defects are seen. Facet hypertrophic changes are noted. Minimal degenerative anterolisthesis of L4 on L5 is noted. Disc space narrowing at L5-S1  is seen.  IMPRESSION: Degenerative change without acute abnormality. Mild anterolisthesis is noted at L4-5.  Pt wants to see Dr. Cari Caraway per chiropractor   Provider: Dr. Olivia Mackie McLean-Scocuzza-Internal Medicine

## 2019-08-25 ENCOUNTER — Encounter: Payer: Self-pay | Admitting: Internal Medicine

## 2019-09-04 ENCOUNTER — Other Ambulatory Visit: Payer: Self-pay | Admitting: Internal Medicine

## 2019-09-04 DIAGNOSIS — N941 Unspecified dyspareunia: Secondary | ICD-10-CM

## 2019-09-04 DIAGNOSIS — N952 Postmenopausal atrophic vaginitis: Secondary | ICD-10-CM

## 2019-09-05 MED ORDER — INTRAROSA 6.5 MG VA INST: 1.0000 "application " | VAGINAL_INSERT | Freq: Every day | VAGINAL | 11 refills | Status: DC

## 2019-10-29 ENCOUNTER — Encounter: Payer: Self-pay | Admitting: Internal Medicine

## 2019-10-31 ENCOUNTER — Other Ambulatory Visit: Payer: Self-pay | Admitting: Internal Medicine

## 2019-10-31 DIAGNOSIS — N951 Menopausal and female climacteric states: Secondary | ICD-10-CM

## 2019-10-31 MED ORDER — ESTRADIOL 10 MCG VA TABS: ORAL_TABLET | VAGINAL | 11 refills | Status: DC

## 2019-11-15 ENCOUNTER — Ambulatory Visit
Admission: RE | Admit: 2019-11-15 | Discharge: 2019-11-15 | Disposition: A | Discharge status: Home / Self Care | Payer: No Typology Code available for payment source | Source: Ambulatory Visit | Attending: Internal Medicine | Admitting: Internal Medicine

## 2019-11-15 DIAGNOSIS — Z1231 Encounter for screening mammogram for malignant neoplasm of breast: Secondary | ICD-10-CM | POA: Insufficient documentation

## 2019-12-22 ENCOUNTER — Encounter: Payer: Self-pay | Admitting: Internal Medicine

## 2019-12-25 ENCOUNTER — Other Ambulatory Visit: Payer: Self-pay | Admitting: Internal Medicine

## 2019-12-25 DIAGNOSIS — G8929 Other chronic pain: Secondary | ICD-10-CM

## 2019-12-25 DIAGNOSIS — M545 Low back pain, unspecified: Secondary | ICD-10-CM

## 2019-12-27 ENCOUNTER — Ambulatory Visit
Admission: RE | Admit: 2019-12-27 | Discharge: 2019-12-27 | Disposition: A | Discharge status: Home / Self Care | Payer: No Typology Code available for payment source | Source: Ambulatory Visit | Attending: Internal Medicine | Admitting: Internal Medicine

## 2019-12-27 ENCOUNTER — Other Ambulatory Visit: Payer: Self-pay

## 2019-12-27 ENCOUNTER — Encounter: Payer: Self-pay | Admitting: Internal Medicine

## 2019-12-27 ENCOUNTER — Other Ambulatory Visit: Payer: Self-pay | Admitting: Internal Medicine

## 2019-12-27 DIAGNOSIS — G8929 Other chronic pain: Secondary | ICD-10-CM

## 2019-12-27 DIAGNOSIS — M545 Low back pain: Secondary | ICD-10-CM | POA: Insufficient documentation

## 2019-12-27 DIAGNOSIS — Z780 Asymptomatic menopausal state: Secondary | ICD-10-CM

## 2019-12-27 MED ORDER — ESTRING 2 MG VA RING: 0.0500 mg | VAGINAL_RING | VAGINAL | 4 refills | Status: DC

## 2019-12-28 ENCOUNTER — Encounter: Payer: Self-pay | Admitting: Lab

## 2019-12-28 ENCOUNTER — Other Ambulatory Visit: Payer: Self-pay | Admitting: Internal Medicine

## 2019-12-28 ENCOUNTER — Encounter: Payer: Self-pay | Admitting: Internal Medicine

## 2019-12-28 DIAGNOSIS — N951 Menopausal and female climacteric states: Secondary | ICD-10-CM

## 2019-12-28 DIAGNOSIS — Z780 Asymptomatic menopausal state: Secondary | ICD-10-CM

## 2019-12-28 DIAGNOSIS — N952 Postmenopausal atrophic vaginitis: Secondary | ICD-10-CM

## 2020-01-05 ENCOUNTER — Encounter: Payer: Self-pay | Admitting: Internal Medicine

## 2020-01-09 NOTE — Addendum Note (Signed)
Addended by: Orland Mustard on: 01/09/2020 02:20 PM   Modules accepted: Orders

## 2020-02-13 ENCOUNTER — Other Ambulatory Visit: Payer: Self-pay

## 2020-02-13 ENCOUNTER — Ambulatory Visit: Payer: No Typology Code available for payment source | Attending: Student

## 2020-02-13 DIAGNOSIS — G8929 Other chronic pain: Secondary | ICD-10-CM | POA: Diagnosis present

## 2020-02-13 DIAGNOSIS — M545 Low back pain, unspecified: Secondary | ICD-10-CM

## 2020-02-13 DIAGNOSIS — M6281 Muscle weakness (generalized): Secondary | ICD-10-CM

## 2020-02-13 NOTE — Therapy (Signed)
[facility] [address], [place], [address] Phone: [phone]   Fax:  [phone]  Physical Therapy Evaluation  Patient Details  Name: Sarah Dennis MRN: [id number] Date of Birth: 09/26/1961 Referring Provider (PT): Marin Olp, [place]   Encounter Date: 02/13/2020  PT End of Session - 02/13/20 1737    Visit Number  1    Number of Visits  13    Date for PT Re-Evaluation  03/29/20    PT Start Time  [id number]    PT Stop Time  1839    PT Time Calculation (min)  61 min    Activity Tolerance  Patient tolerated treatment well    Behavior During Therapy  [facility] for tasks assessed/performed       Past Medical History:  Diagnosis Date  . Arthritis   . Barrett's esophagus   . Carpal tunnel syndrome 01/08/2016   Right  . Esophagitis   . Esophagitis    eosinophillic   . GERD (gastroesophageal reflux disease)   . Hyperlipidemia   . Shingles    left eye x 2     Past Surgical History:  Procedure Laterality Date  . CESAREAN SECTION  1988  . COLONOSCOPY  07/2013   Dr. Vira Agar  . mole removal  15 yaers ago  . [place]     2007  . TUBAL LIGATION  2000  . VEIN SURGERY  2007,2012    There were no vitals filed for this visit.   Subjective Assessment - 02/13/20 1741    Subjective  Low back (L5/S1 R > L): 0/10 currently (pt sitting with her R leg crossed over her L); 5/10 at most for the past month.    Pertinent History  Low back pain. 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 Pt was told that L3,4,5 is affected. Wants to know what she needs to do to maintain her function. Does squats, planks, bridge, seated trunk rotation, side bending. Does not feel the back pain until the day after. Works 3 twleve hour shifts. Back bothers her towards the end of her shift. Feels like walking around, sitting, maybe leaning forward might bother her back. The  muscle relaxer she uses at night which made a big difference. The baclofen helps her sleep at night. Pain radiates to both posterior hips. Went to the chiropractor which helped but felt like she did not get anywhere.  Pt states that she is not hurting constantly.  Had a back flare up December 2020 after prolonged sitting (had to go to [place]).    Patient Stated Goals  To know what to do to maintain her current function.    Currently in Pain?  No/denies    Pain Score  0-No pain    Pain Location  Back    Pain Orientation  Posterior;Left;Right    Pain Descriptors / Indicators  Aching;Tightness    Pain Type  Chronic pain    Pain Onset  More than a month ago    Pain Frequency  Occasional    Aggravating Factors   supine and prone; Walking at times pt would feel a pulling sensation up her spine randomly (especially during long days at work) which pt has to stop to let it calm down,  Working long shifts at work.   Standing up after prolonged sitting.    Pain Relieving  Factors  TENS, time, ibuprofen, heat, back extension helps initially but then hurts the next day. Sitting down         [facility] PT Assessment - 02/13/20 1741      Assessment   Medical Diagnosis  low back pain    Referring Provider (PT)  Marin Olp, PA    Onset Date/Surgical Date  01/20/20    Prior Therapy  none      Precautions   Precaution Comments  no known precautions      Restrictions   Other Position/Activity Restrictions  no known restrictions      Balance Screen   Has the patient fallen in the past 6 months  No    Has the patient had a decrease in activity level because of a fear of falling?   No    Is the patient reluctant to leave their home because of a fear of falling?   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    no pain with overpressure   Lumbar - Right Side [facility]   no pain with overpressure   Lumbar - Left Side Bend  WFL   no pain with overpressure   Lumbar - Right Rotation  WFL   no pain with overpressure   Lumbar - Left Rotation  WFL   no pain with overpressure     Strength   Right Hip Flexion  4-/5   with R low back pull;  no symptoms with core activation   Right Hip Extension  4+/5   seated manually resisted   Right Hip ABduction  4/5    Left Hip Flexion  4-/5   with L low back pull;  no symptoms with core activation   Left Hip Extension  4+/5   seated manually resisted   Left Hip ABduction  4-/5    Right Knee Flexion  5/5    Right Knee Extension  5/5    Left Knee Flexion  4+/5    Left Knee Extension  5/5      Special Tests   Other special tests  (-) piriformis test bilaterally.  (+) long sit test suggesting anterior nutation of L innominate (or at L5/S1 area)       Ambulation/Gait   Gait Comments  Decreased stance R LE, increased L trunk rotation during R LE stance phase                Objective measurements completed on examination: See above findings.    No latex band allergies  Palpation: R L5 TP more palpable compared to L and TTP. TTP R L 4 TP  no TTP B piriformis muscle area. L ASIS more anterior  Supine position: L LE longer   Decreased B femoral control with ascending and descending stairs  Medbridge Access Code [email or web address]  Therapeutic exercise  Supine L hip extension isometrics in SKTC position 10x5 seconds for 2 sets  Supine transversus abdominis contraction 10x5 seconds   Reviewed HEP. Pt demonstrated and verbalized  understanding. Handout provided.   Improved exercise technique, movement at target joints, use of target muscles after mod verbal, visual, tactile cues.      Response to treatment No complain of back pain with exercises. No R or L posterior hip pain with manually resisted hip flexion with activating core muscles during MMT.    Clinical impression  Patient is a 59 year old female who came to physical therapy secondary to chronic low back and bilateral posterior hip pain. She also presents with altered posture, core weakness, hip weakness, reproduction of symptoms with trunk flexion as well as with resisted hip flexion, decreased symptoms with core activation, TTP to R low back, decreased bilateral femoral control, and difficulty getting out of prolonged supine, prone, and sitting positions, as well as difficulty tolerating long work shifts secondary to low back pain. Pt will benefit from skilled physical therapy services to address the aforementioned deficits.           PT Education - 02/13/20 1909    Education Details  ther-ex, HEP, plan of care    Person(s) Educated  Patient    Methods  Explanation;Demonstration;Tactile cues;Verbal cues;Handout    Comprehension  Returned demonstration;Verbalized understanding       PT Short Term Goals - 02/13/20 1854      PT SHORT TERM GOAL #1   Title  Patient will be independent with her HEP to decrease pain, improve strength, function, and ability to tolerate prolonged positions.    Baseline  Pt has started her HEP (02/13/20)    Time  3    Period  Weeks    Status  New    Target Date  03/08/20        PT Long Term Goals - 02/13/20 1857      PT LONG TERM GOAL #1   Title  Patient will have a decrease in low back and B posterior hip pain to 2/10 or less at worst to promote ability to transfer out of prolonged supine, prone, and sitting positions more comfortably.    Baseline  5/10 low back pain at most for the past month (02/13/20)    Time  6    Period  Weeks    Status  New    Target Date  03/29/20      PT LONG TERM GOAL #2   Title  Pt will improve bilateral hip strength by at least 1/2  MMT grade to promote ability to perform functional tasks.    Time  6    Period  Weeks    Status  New    Target Date  03/29/20      PT LONG TERM GOAL #3   Title  Pt will improve her lumbar FOTO score by at least 10 points as a demonstration of improved function.    Baseline  Lumbar FOTO: 60 (02/13/20)    Time  6    Status  New    Target Date  03/29/20             Plan - 02/13/20 1850    Clinical Impression Statement  Patient is a 59 year old female who came to physical therapy secondary to chronic low back and bilateral posterior hip pain. She also presents with altered posture, core weakness, hip weakness, reproduction of symptoms with trunk flexion as well as with resisted hip flexion, decreased symptoms with core activation, TTP to R low back, decreased bilateral femoral control, and difficulty getting out of prolonged supine, prone,  and sitting positions, as well as difficulty tolerating long work shifts secondary to low back pain. Pt will benefit from skilled physical therapy services to address the aforementioned deficits.    Personal Factors and Comorbidities  Age;Fitness;Time since onset of injury/illness/exacerbation;Past/Current Experience;Profession    Examination-Activity Limitations  Sit;Transfers;Sleep;Stand;Squat    Stability/Clinical Decision Making  Stable/Uncomplicated    Clinical Decision Making  Low    Rehab Potential  Fair    PT Frequency  2x / week    PT Duration  6 weeks    PT Treatment/Interventions  Therapeutic activities;Therapeutic exercise;Neuromuscular re-education;Patient/family education;Manual techniques;Dry needling;Aquatic Therapy;Electrical Stimulation;Iontophoresis 4mg /ml Dexamethasone    PT Next Visit Plan  thoracic extension, hip extension, trunk and glute strengthening, lumbopelvic/hip control, manual techniques, modalities PRN    PT Home Exercise Plan  Medbridge Access Code [email or web address]    Consulted and Agree with Plan of Care  Patient        Patient will benefit from skilled therapeutic intervention in order to improve the following deficits and impairments:  Pain, Postural dysfunction, Improper body mechanics, Decreased strength  Visit Diagnosis: Chronic bilateral low back pain, unspecified whether sciatica present - Plan: PT plan of care cert/re-cert  Muscle weakness (generalized) - Plan: PT plan of care cert/re-cert     Problem List Patient Active Problem List   Diagnosis Date Noted  . Vaginal atrophy 08/15/2019  . Menopause 06/01/2018  . Gluten intolerance 06/01/2018  . Annual physical exam 05/04/2018  . Overweight (BMI 25.0-29.9) 05/04/2018  . Barrett's esophagus without dysplasia 05/04/2018  . Family history of hypothyroidism 05/04/2018  . Hyperlipidemia 03/30/2018  . Skin lesion of face 05/13/2017  . Carpal tunnel syndrome 01/08/2016  . Mass of thigh 02/16/2015  . Lesion of neck 02/16/2015  . Sebaceous cyst 05/01/2014    Joneen Boers PT, DPT   02/13/2020, 7:13 PM  [facility] [address] [place], [address] Phone: [phone]   Fax:  [phone]  Name: LEXEY CADAVID MRN: [id number] Date of Birth: 1961-10-08

## 2020-02-13 NOTE — Patient Instructions (Signed)
Access Code: [email or web address] URL: [email or web address] Date: 02/13/2020 Prepared by: Joneen Boers  Exercises Supine Transversus Abdominis Bracing - Hands on Stomach - 1 x daily - 7 x weekly - 3 sets - 10 reps - 5 hold

## 2020-02-14 ENCOUNTER — Ambulatory Visit: Payer: No Typology Code available for payment source

## 2020-02-16 ENCOUNTER — Other Ambulatory Visit: Payer: Self-pay

## 2020-02-16 ENCOUNTER — Ambulatory Visit: Payer: No Typology Code available for payment source

## 2020-02-16 DIAGNOSIS — M545 Low back pain: Secondary | ICD-10-CM | POA: Diagnosis not present

## 2020-02-16 DIAGNOSIS — G8929 Other chronic pain: Secondary | ICD-10-CM

## 2020-02-16 DIAGNOSIS — M6281 Muscle weakness (generalized): Secondary | ICD-10-CM

## 2020-02-16 NOTE — Patient Instructions (Addendum)
  Access Code: [email or web address] URL: [email or web address] Date: 02/16/2020 Prepared by: Joneen Boers  Exercises Supine Transversus Abdominis Bracing - Hands on Stomach - 1 x daily - 7 x weekly - 3 sets - 10 reps - 5 hold Bent Knee Fallouts - 1 x daily - 7 x weekly - 3 sets - 10 reps Supine Bridge - 1 x daily - 7 x weekly - 3 sets - 10 reps

## 2020-02-16 NOTE — Therapy (Signed)
[facility] [address], [place], [address] Phone: [phone]   Fax:  [phone]  Physical Therapy Treatment  Patient Details  Name: Sarah Dennis  MRN: [id number] Date of Birth: [birth year] Referring Provider (PT): Marin Olp, [place]   Encounter Date: 02/16/2020  PT End of Session - 02/16/20 1602    Visit Number  2    Number of Visits  13    Date for PT Re-Evaluation  03/29/20    PT Start Time  1602    PT Stop Time  1642    PT Time Calculation (min)  40 min    Activity Tolerance  Patient tolerated treatment well    Behavior During Therapy  [facility] for tasks assessed/performed       Past Medical History:  Diagnosis Date  . Arthritis   . Barrett's esophagus   . Carpal tunnel syndrome 01/08/2016   Right  . Esophagitis   . Esophagitis    eosinophillic   . GERD (gastroesophageal reflux disease)   . Hyperlipidemia   . Shingles    left eye x 2     Past Surgical History:  Procedure Laterality Date  . CESAREAN SECTION  1988  . COLONOSCOPY  07/2013   Dr. Vira Agar  . mole removal  15 yaers ago  . [place]     2007  . TUBAL LIGATION  2000  . VEIN SURGERY  2007,2012    There were no vitals filed for this visit.  Subjective Assessment - 02/16/20 1603    Subjective  Back is good. Went to work yesterday. The L SKTC stretch helps. The tightening of the abdomen helped her at work as well. The day after the eval was fine for her back. Felt a lateral hamstrings pull mid afternoon, comes and goes. Stretching eases it off.    Pertinent History  Low back pain. 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 Pt was told that L3,4,5 is affected. Wants to know what she needs to do to maintain her function. Does squats, planks, bridge, seated trunk rotation, side bending. Does not feel the back pain until the day after. Works 3 twleve hour shifts. Back  bothers her towards the end of her shift. Feels like walking around, sitting, maybe leaning forward might bother her back. The muscle relaxer she uses at night which made a big difference. The baclofen helps her sleep at night. Pain radiates to both posterior hips. Went to the chiropractor which helped but felt like she did not get anywhere.  Pt states that she is not hurting constantly.  Had a back flare up December 2020 after prolonged sitting (had to go to [place]).    Patient Stated Goals  To know what to do to maintain her current function.    Currently in Pain?  No/denies    Pain Score  0-No pain    Pain Onset  More than a month ago                               PT Education - 02/16/20 1627    Education Details  ther-ex, HEP    Person(s) Educated  Patient    Methods  Explanation;Demonstration;Tactile cues;Verbal cues;Handout    Comprehension  Returned demonstration;Verbalized understanding       Objective  No latex band allergies  Palpation: R L5 TP more palpable compared to L and TTP. TTP R L 4 TP  no TTP B piriformis muscle area. L ASIS more anterior  Supine position: L LE longer   Decreased B femoral control with ascending and descending stairs  Medbridge Access Code [email or web address]  Therapeutic exercise  Standing posture: slight R side bend at thoracolumbar junction. No difference in comfort level when corrected.    Supine transversus abdoninis contraction   With hip fall out 10x2 each LE   Tendency for R pelvic rotation during R hip fallout   Increased time secondary to emphasis on proper technique.    Bridge with B shoulder extension isometrics, ankle DF, glute squeeze  10x3  S/L hip abduction   R 10x2  L 10x2  Good glute med muscle use felt   Standing low rows red band 10x, then 5x5 seconds for  2 sets   Standing B shoulder extension red band 5x2   Improved exercise technique, movement at target joints, use of target  muscles after mod verbal, visual, tactile cues.     Response to treatment Good muscle use felt with exercises. Pt tolerated session well without aggravation of symptoms.    Clinical impression Improved ability to perform work duties more comfortably since last session based on subjective reports. Continued working on core strengthening to promote lumbopelvic stability and decrease stress to low back. Demonstrates weak core control during hip fallouts. Pt tolerated session well without aggravation of symptoms. Pt will benefit from continued skilled physical therapy services to decrease pain, improve strength and function.    PT Short Term Goals - 02/13/20 1854      PT SHORT TERM GOAL #1   Title  Patient will be independent with her HEP to decrease pain, improve strength, function, and ability to tolerate prolonged positions.    Baseline  Pt has started her HEP (02/13/20)    Time  3    Period  Weeks    Status  New    Target Date  03/08/20        PT Long Term Goals - 02/13/20 1857      PT LONG TERM GOAL #1   Title  Patient will have a decrease in low back and B posterior hip pain to 2/10 or less at worst to promote ability to transfer out of prolonged supine, prone, and sitting positions more comfortably.    Baseline  5/10 low back pain at most for the past month (02/13/20)    Time  6    Period  Weeks    Status  New    Target Date  03/29/20      PT LONG TERM GOAL #2   Title  Pt will improve bilateral hip strength by at least 1/2 MMT grade to promote ability to perform functional tasks.    Time  6    Period  Weeks    Status  New    Target Date  03/29/20      PT LONG TERM GOAL #3   Title  Pt will improve her lumbar FOTO score by at least 10 points as a demonstration of improved function.    Baseline  Lumbar FOTO: 60 (02/13/20)    Time  6    Status  New    Target Date  03/29/20            Plan - 02/16/20 1628    Clinical Impression Statement  Improved ability to  perform work duties more comfortably since last session based on subjective reports. Continued working on core strengthening to promote lumbopelvic stability and decrease stress to low back. Demonstrates weak core control during hip fallouts. Pt tolerated session well without aggravation of symptoms. Pt will benefit from continued skilled physical therapy services to decrease pain, improve strength and function.    Personal Factors and Comorbidities  Age;Fitness;Time since onset of injury/illness/exacerbation;Past/Current Experience;Profession    Examination-Activity Limitations  Sit;Transfers;Sleep;Stand;Squat    Stability/Clinical Decision Making  Stable/Uncomplicated    Rehab Potential  Fair    PT Frequency  2x / week    PT Duration  6 weeks    PT Treatment/Interventions  Therapeutic activities;Therapeutic exercise;Neuromuscular re-education;Patient/family education;Manual techniques;Dry needling;Aquatic Therapy;Electrical Stimulation;Iontophoresis 4mg /ml Dexamethasone    PT Next Visit Plan  thoracic extension, hip extension, trunk and glute strengthening, lumbopelvic/hip control, manual techniques, modalities PRN    PT Home Exercise Plan  Medbridge Access Code [email or web address]    Consulted and Agree with Plan of Care  Patient       Patient will benefit from skilled therapeutic intervention in order to improve the following deficits and impairments:  Pain, Postural dysfunction, Improper body mechanics, Decreased strength  Visit Diagnosis: Chronic bilateral low back pain, unspecified whether sciatica present  Muscle weakness (generalized)     Problem List Patient Active Problem List   Diagnosis Date Noted  . Vaginal atrophy 08/15/2019  . Menopause 06/01/2018  . Gluten intolerance 06/01/2018  . Annual physical exam 05/04/2018  . Overweight (BMI 25.0-29.9) 05/04/2018  . Barrett's esophagus without dysplasia 05/04/2018  . Family history of hypothyroidism 05/04/2018  . Hyperlipidemia  03/30/2018  . Skin lesion of face 05/13/2017  . Carpal tunnel syndrome 01/08/2016  . Mass of thigh 02/16/2015  . Lesion of neck 02/16/2015  . Sebaceous cyst 05/01/2014    Joneen Boers PT, DPT   02/16/2020, 6:36 PM  [facility] [address] [place], [address] Phone: [phone]   Fax:  [phone]  Name: AZAELIA KINCANNON MRN: [id number] Date of Birth: 02/01/1961

## 2020-02-20 ENCOUNTER — Other Ambulatory Visit: Payer: Self-pay

## 2020-02-20 ENCOUNTER — Ambulatory Visit: Payer: No Typology Code available for payment source

## 2020-02-20 DIAGNOSIS — M545 Low back pain, unspecified: Secondary | ICD-10-CM

## 2020-02-20 DIAGNOSIS — M6281 Muscle weakness (generalized): Secondary | ICD-10-CM

## 2020-02-20 DIAGNOSIS — G8929 Other chronic pain: Secondary | ICD-10-CM

## 2020-02-20 NOTE — Patient Instructions (Addendum)
Recommended for pt to hold off the L SKTC exercise at home. Pt demonstrated and verbalized understanding.       Access Code: [email or web address] URL: [email or web address] Date: 02/20/2020 Prepared by: Joneen Boers  Exercises Supine Transversus Abdominis Bracing - Hands on Stomach - 1 x daily - 7 x weekly - 3 sets - 10 reps - 5 hold Bent Knee Fallouts - 1 x daily - 7 x weekly - 3 sets - 10 reps Supine Bridge - 1 x daily - 7 x weekly - 3 sets - 10 reps Sidelying Hip Abduction - 1 x daily - 7 x weekly - 2 sets - 10 reps

## 2020-02-20 NOTE — Therapy (Signed)
[facility] [address], [place], [address] Phone: [phone]   Fax:  [phone]  Physical Therapy Treatment  Patient Details  Name: Sarah Dennis MRN: [id number] Date of Birth: 06/21/1961 Referring Provider (PT): Marin Olp, [place]   Encounter Date: 02/20/2020  PT End of Session - 02/20/20 1737    Visit Number  3    Number of Visits  13    Date for PT Re-Evaluation  03/29/20    PT Start Time  [id number]    PT Stop Time  1819    PT Time Calculation (min)  42 min    Activity Tolerance  Patient tolerated treatment well    Behavior During Therapy  [facility] for tasks assessed/performed       Past Medical History:  Diagnosis Date  . Arthritis   . Barrett's esophagus   . Carpal tunnel syndrome 01/08/2016   Right  . Esophagitis   . Esophagitis    eosinophillic   . GERD (gastroesophageal reflux disease)   . Hyperlipidemia   . Shingles    left eye x 2     Past Surgical History:  Procedure Laterality Date  . CESAREAN SECTION  1988  . COLONOSCOPY  07/2013   Dr. Vira Agar  . mole removal  15 yaers ago  . [place]     2007  . TUBAL LIGATION  2000  . VEIN SURGERY  2007,2012    There were no vitals filed for this visit.  Subjective Assessment - 02/20/20 1738    Subjective  Low back is doing good. Getting better. Feel L lateral hamstring pull. Not sure how its happening. No back pain currently. Just feels a pulling sensation L lateral hamstring (2/10 pulling feeling currently)    Pertinent History  Low back pain. 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 Pt was told that L3,4,5 is affected. Wants to know what she needs to do to maintain her function. Does squats, planks, bridge, seated trunk rotation, side bending. Does not feel the back pain until the day after. Works 3 twleve hour shifts. Back bothers her towards the end of her shift. Feels  like walking around, sitting, maybe leaning forward might bother her back. The muscle relaxer she uses at night which made a big difference. The baclofen helps her sleep at night. Pain radiates to both posterior hips. Went to the chiropractor which helped but felt like she did not get anywhere.  Pt states that she is not hurting constantly.  Had a back flare up December 2020 after prolonged sitting (had to go to [place]).    Patient Stated Goals  To know what to do to maintain her current function.    Currently in Pain?  No/denies   just a pulling sensation in L hamstrings   Pain Onset  More than a month ago                               PT Education - 02/20/20 1748    Education Details  ther-ex    Person(s) Educated  Patient    Methods  Explanation;Demonstration;Tactile cues;Verbal cues    Comprehension  Returned demonstration;Verbalized understanding      Objective    No latex band allergies  Palpation: R L5 TP more palpable compared to L  and TTP. TTP R L 4 TP no TTP B piriformis muscle area. L ASIS more anterior  Supine position: L LE longer   Decreased B femoral control with ascending and descending stairs  MedbridgeAccess Code [email or web address]  Therapeutic exercise  Seated anterior pelvic tilt with B scapular retraction 10x5 seconds for 3 sets  No L hamstring pull afterwars   Recommended for pt to hold off the L SKTC exercise at home. Pt demonstrated and verbalized understanding.   Prone on elbows with B Hip IR 2 minutes deep breaths.   Hold off exercise next visit   Prone glute max set 10x5 seconds each LE for 2 sets, then 8x5 seconds. L Low back discomfort, eases with rest and seated trunk flexion  Hold off exercise   Prone transversus abdominis contraction 10x5 seconds  Seated trunk flexion position. Decreased low back discomfort.    Hold off back extensions.    Seated physioball rolls  Flexion 10x5 seconds for 2  sets   Supine transversus abdoninis contraction              With hip fall out 10x each LE   Improved control compared to previous session                                                Increased time secondary to emphasis on proper technique.    With alternating leg extension 10x, then 5x each LE    Difficult.    Sit <> stand with transversus abdominis and glute max contraction 5x   Standing low rows red band 10x5 seconds   Improved exercise technique, movement at target joints, use of target muscles after mod verbal, visual, tactile cues.    Response to treatment Good muscle use felt with exercises.    Clinical impression Improved lumbopelvic control with hip fallouts compared to previous session observed. Improving low back pain based on pt subjective reports. Continued working on glute and trunk muscle activation to continue progress. Pt will benefit from continued skilled physical therapy services to decrease pain, improve strength and function.    PT Short Term Goals - 02/13/20 1854      PT SHORT TERM GOAL #1   Title  Patient will be independent with her HEP to decrease pain, improve strength, function, and ability to tolerate prolonged positions.    Baseline  Pt has started her HEP (02/13/20)    Time  3    Period  Weeks    Status  New    Target Date  03/08/20        PT Long Term Goals - 02/13/20 1857      PT LONG TERM GOAL #1   Title  Patient will have a decrease in low back and B posterior hip pain to 2/10 or less at worst to promote ability to transfer out of prolonged supine, prone, and sitting positions more comfortably.    Baseline  5/10 low back pain at most for the past month (02/13/20)    Time  6    Period  Weeks    Status  New    Target Date  03/29/20      PT LONG TERM GOAL #2   Title  Pt will improve bilateral hip strength by at least 1/2 MMT grade to promote ability to perform functional tasks.    Time  6    Period  Weeks    Status   New    Target Date  03/29/20      PT LONG TERM GOAL #3   Title  Pt will improve her lumbar FOTO score by at least 10 points as a demonstration of improved function.    Baseline  Lumbar FOTO: 60 (02/13/20)    Time  6    Status  New    Target Date  03/29/20            Plan - 02/20/20 1748    Clinical Impression Statement  Improved lumbopelvic control with hip fallouts compared to previous session observed. Improving low back pain based on pt subjective reports. Continued working on glute and trunk muscle activation to continue progress. Pt will benefit from continued skilled physical therapy services to decrease pain, improve strength and function.    Personal Factors and Comorbidities  Age;Fitness;Time since onset of injury/illness/exacerbation;Past/Current Experience;Profession    Examination-Activity Limitations  Sit;Transfers;Sleep;Stand;Squat    Stability/Clinical Decision Making  Stable/Uncomplicated    Rehab Potential  Fair    PT Frequency  2x / week    PT Duration  6 weeks    PT Treatment/Interventions  Therapeutic activities;Therapeutic exercise;Neuromuscular re-education;Patient/family education;Manual techniques;Dry needling;Aquatic Therapy;Electrical Stimulation;Iontophoresis 4mg /ml Dexamethasone    PT Next Visit Plan  thoracic extension, hip extension, trunk and glute strengthening, lumbopelvic/hip control, manual techniques, modalities PRN    PT Home Exercise Plan  Medbridge Access Code [email or web address]    Consulted and Agree with Plan of Care  Patient       Patient will benefit from skilled therapeutic intervention in order to improve the following deficits and impairments:  Pain, Postural dysfunction, Improper body mechanics, Decreased strength  Visit Diagnosis: Chronic bilateral low back pain, unspecified whether sciatica present  Muscle weakness (generalized)     Problem List Patient Active Problem List   Diagnosis Date Noted  . Vaginal atrophy 08/15/2019  .  Menopause 06/01/2018  . Gluten intolerance 06/01/2018  . Annual physical exam 05/04/2018  . Overweight (BMI 25.0-29.9) 05/04/2018  . Barrett's esophagus without dysplasia 05/04/2018  . Family history of hypothyroidism 05/04/2018  . Hyperlipidemia 03/30/2018  . Skin lesion of face 05/13/2017  . Carpal tunnel syndrome 01/08/2016  . Mass of thigh 02/16/2015  . Lesion of neck 02/16/2015  . Sebaceous cyst 05/01/2014    Joneen Boers PT, DPT   02/20/2020, 6:23 PM  [facility] [address] [place], [address] Phone: [phone]   Fax:  [phone]  Name: Sarah Dennis MRN: [id number] Date of Birth: [birth year]

## 2020-02-23 ENCOUNTER — Other Ambulatory Visit: Payer: Self-pay

## 2020-02-23 ENCOUNTER — Ambulatory Visit: Payer: No Typology Code available for payment source | Attending: Student

## 2020-02-23 DIAGNOSIS — M6281 Muscle weakness (generalized): Secondary | ICD-10-CM | POA: Diagnosis present

## 2020-02-23 DIAGNOSIS — M545 Low back pain, unspecified: Secondary | ICD-10-CM

## 2020-02-23 DIAGNOSIS — G8929 Other chronic pain: Secondary | ICD-10-CM | POA: Insufficient documentation

## 2020-02-23 NOTE — Patient Instructions (Addendum)
Hold off on supine bridge HEP  Continue performing bilateral scapular retraction in upright posture 10x3 with 5 second holds  Continue squeezing glute max muscle when ambulating.

## 2020-02-23 NOTE — Therapy (Signed)
[facility] [address], [place], [address] Phone: [phone]   Fax:  [phone]  Physical Therapy Treatment  Patient Details  Name: Sarah Dennis MRN: [id number] Date of Birth: 10-Apr-1961 Referring Provider (PT): Marin Olp, [place]   Encounter Date: 02/23/2020  PT End of Session - 02/23/20 1518    Visit Number  4    Number of Visits  13    Date for PT Re-Evaluation  03/29/20    PT Start Time  1518    PT Stop Time  1602    PT Time Calculation (min)  44 min    Activity Tolerance  Patient tolerated treatment well    Behavior During Therapy  [facility] for tasks assessed/performed       Past Medical History:  Diagnosis Date  . Arthritis   . Barrett's esophagus   . Carpal tunnel syndrome 01/08/2016   Right  . Esophagitis   . Esophagitis    eosinophillic   . GERD (gastroesophageal reflux disease)   . Hyperlipidemia   . Shingles    left eye x 2     Past Surgical History:  Procedure Laterality Date  . CESAREAN SECTION  1988  . COLONOSCOPY  07/2013   Dr. Vira Agar  . mole removal  15 yaers ago  . [place]     2007  . TUBAL LIGATION  2000  . VEIN SURGERY  2007,2012    There were no vitals filed for this visit.  Subjective Assessment - 02/23/20 1519    Subjective  L hamstrings felt worse after Monday. Feels like the bridge is aggravting it. 5/10 L lateral hamstrings when walking. No low back pain currently.    Pertinent History  Low back pain. 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 Pt was told that L3,4,5 is affected. Wants to know what she needs to do to maintain her function. Does squats, planks, bridge, seated trunk rotation, side bending. Does not feel the back pain until the day after. Works 3 twleve hour shifts. Back bothers her towards the end of her shift. Feels like walking around, sitting, maybe leaning forward might  bother her back. The muscle relaxer she uses at night which made a big difference. The baclofen helps her sleep at night. Pain radiates to both posterior hips. Went to the chiropractor which helped but felt like she did not get anywhere.  Pt states that she is not hurting constantly.  Had a back flare up December 2020 after prolonged sitting (had to go to [place]).    Patient Stated Goals  To know what to do to maintain her current function.    Currently in Pain?  Yes    Pain Score  5     Pain Onset  More than a month ago                               PT Education - 02/23/20 1526    Education Details  ther-ex    Person(s) Educated  Patient    Methods  Explanation;Demonstration;Tactile cues;Verbal cues    Comprehension  Returned demonstration;Verbalized understanding      Objective    No latex band allergies  Palpation: R L5 TP more palpable compared to L and TTP. TTP R L 4 TP no TTP  B piriformis muscle area. L ASIS more anterior  Supine position: L LE longer   Decreased B femoral control with ascending and descending stairs  MedbridgeAccess Code [email or web address]  Therapeutic exercise  Seated manually resisted L lateral shift isometrics in neutral to counter R lateral shift posture in sitting. 10x5 seconds for 3 sets  Seated manually resisted trunk rotation in neutral  Upper L trunk rotation to promote R lower lumbar rotation 10x5 seconds for 3 sets   Decreased L lateral hamstrings pull    Sitting up straight with B scapular retraction 10x5 seconds for 3 sets             Decreased L hamstrings pain to 1/10 afterwards  Standing R trunk rotation 10x3  L sciatic nerve symptoms  Standing low rows red band 10x5 seconds for 3 sets  No L sciatic symptoms.   Decreased L hamstrings symptoms   Standing chops to the L to promote R lumbar rotation single red band 10x5 seconds for 2 sets, then 5x5 seconds    Sit <> stand with transversus abdominis  contraction 5x  Seated straight pallof press double red band 10x2 with 5 second holds   Seated hip extension isometrics  L 10x5 seconds for 2 sets   Improved exercise technique, movement at target joints, use of target muscles after mod verbal, visual, tactile cues.    Response to treatment Good muscle use felt with exercises. Decreased L lateral hamstrings pain with treatment. No complain of low back pain throughout session. 1/10 at end of session but seems to be "working itself (hamstrings symptoms) out."    Clinical impression Decreased L lateral hamstrings with treatment to decrease L lumbar rotation, promote gentle lumbar extension, trunk and glute max muscle activation. Pt will benefit from continued skilled physical therapy services to decrease pain, improve strength and function.    PT Short Term Goals - 02/13/20 1854      PT SHORT TERM GOAL #1   Title  Patient will be independent with her HEP to decrease pain, improve strength, function, and ability to tolerate prolonged positions.    Baseline  Pt has started her HEP (02/13/20)    Time  3    Period  Weeks    Status  New    Target Date  03/08/20        PT Long Term Goals - 02/13/20 1857      PT LONG TERM GOAL #1   Title  Patient will have a decrease in low back and B posterior hip pain to 2/10 or less at worst to promote ability to transfer out of prolonged supine, prone, and sitting positions more comfortably.    Baseline  5/10 low back pain at most for the past month (02/13/20)    Time  6    Period  Weeks    Status  New    Target Date  03/29/20      PT LONG TERM GOAL #2   Title  Pt will improve bilateral hip strength by at least 1/2 MMT grade to promote ability to perform functional tasks.    Time  6    Period  Weeks    Status  New    Target Date  03/29/20      PT LONG TERM GOAL #3   Title  Pt will improve her lumbar FOTO score by at least 10 points as a demonstration of improved function.     Baseline  Lumbar FOTO: 60 (02/13/20)  Time  6    Status  New    Target Date  03/29/20            Plan - 02/23/20 1531    Clinical Impression Statement  Decreased L lateral hamstrings with treatment to decrease L lumbar rotation, promote gentle lumbar extension, trunk and glute max muscle activation. Pt will benefit from continued skilled physical therapy services to decrease pain, improve strength and function.    Personal Factors and Comorbidities  Age;Fitness;Time since onset of injury/illness/exacerbation;Past/Current Experience;Profession    Examination-Activity Limitations  Sit;Transfers;Sleep;Stand;Squat    Stability/Clinical Decision Making  Stable/Uncomplicated    Rehab Potential  Fair    PT Frequency  2x / week    PT Duration  6 weeks    PT Treatment/Interventions  Therapeutic activities;Therapeutic exercise;Neuromuscular re-education;Patient/family education;Manual techniques;Dry needling;Aquatic Therapy;Electrical Stimulation;Iontophoresis 4mg /ml Dexamethasone    PT Next Visit Plan  thoracic extension, hip extension, trunk and glute strengthening, lumbopelvic/hip control, manual techniques, modalities PRN    PT Home Exercise Plan  Medbridge Access Code [email or web address]    Consulted and Agree with Plan of Care  Patient       Patient will benefit from skilled therapeutic intervention in order to improve the following deficits and impairments:  Pain, Postural dysfunction, Improper body mechanics, Decreased strength  Visit Diagnosis: Chronic bilateral low back pain, unspecified whether sciatica present  Muscle weakness (generalized)     Problem List Patient Active Problem List   Diagnosis Date Noted  . Vaginal atrophy 08/15/2019  . Menopause 06/01/2018  . Gluten intolerance 06/01/2018  . Annual physical exam 05/04/2018  . Overweight (BMI 25.0-29.9) 05/04/2018  . Barrett's esophagus without dysplasia 05/04/2018  . Family history of hypothyroidism 05/04/2018  .  Hyperlipidemia 03/30/2018  . Skin lesion of face 05/13/2017  . Carpal tunnel syndrome 01/08/2016  . Mass of thigh 02/16/2015  . Lesion of neck 02/16/2015  . Sebaceous cyst 05/01/2014    Joneen Boers PT, DPT   02/23/2020, 5:12 PM  [facility] [address], [place], [address] Phone: [phone]   Fax:  [phone]  Name: ELIYANA Dennis MRN: [id number] Date of Birth: 1961-06-16

## 2020-02-27 ENCOUNTER — Other Ambulatory Visit: Payer: Self-pay

## 2020-02-27 ENCOUNTER — Ambulatory Visit: Payer: No Typology Code available for payment source

## 2020-02-27 DIAGNOSIS — M545 Low back pain: Secondary | ICD-10-CM | POA: Diagnosis not present

## 2020-02-27 DIAGNOSIS — M6281 Muscle weakness (generalized): Secondary | ICD-10-CM

## 2020-02-27 DIAGNOSIS — G8929 Other chronic pain: Secondary | ICD-10-CM

## 2020-02-27 NOTE — Therapy (Signed)
[facility] [address] [place], [address] Phone: [phone]   Fax:  [phone]  Physical Therapy Treatment  Patient Details  Name: Sarah Dennis MRN: [id number] Date of Birth: [birth year] Referring Provider (PT): Marin Olp, [place]   Encounter Date: 02/27/2020  PT End of Session - 02/27/20 1122    Visit Number  5    Number of Visits  13    Date for PT Re-Evaluation  03/29/20    PT Start Time  1122    PT Stop Time  1202    PT Time Calculation (min)  40 min    Activity Tolerance  Patient tolerated treatment well    Behavior During Therapy  [facility] for tasks assessed/performed       Past Medical History:  Diagnosis Date  . Arthritis   . Barrett's esophagus   . Carpal tunnel syndrome 01/08/2016   Right  . Esophagitis   . Esophagitis    eosinophillic   . GERD (gastroesophageal reflux disease)   . Hyperlipidemia   . Shingles    left eye x 2     Past Surgical History:  Procedure Laterality Date  . CESAREAN SECTION  1988  . COLONOSCOPY  07/2013   Dr. Vira Agar  . mole removal  15 yaers ago  . [place]     2007  . TUBAL LIGATION  2000  . VEIN SURGERY  2007,2012    There were no vitals filed for this visit.  Subjective Assessment - 02/27/20 1123    Subjective  Pt states that she feels her symptoms the next day. Felt stiffness L lateral thigh and posterior knee the next day. Worked Saturday and Sunday and her L knee feels swollen. Took a break with her HEP yesterday and it feels better.  0/10 low back, 1/10 L lateral knee in sitting, 2/10 L lateral knee when walking. Sitting takes her L lateral knee pain way.    Pertinent History  Low back pain. 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 Pt was told that L3,4,5 is affected. Wants to know what she needs to do to maintain her function. Does squats, planks, bridge, seated trunk  rotation, side bending. Does not feel the back pain until the day after. Works 3 twleve hour shifts. Back bothers her towards the end of her shift. Feels like walking around, sitting, maybe leaning forward might bother her back. The muscle relaxer she uses at night which made a big difference. The baclofen helps her sleep at night. Pain radiates to both posterior hips. Went to the chiropractor which helped but felt like she did not get anywhere.  Pt states that she is not hurting constantly.  Had a back flare up December 2020 after prolonged sitting (had to go to [place]).    Patient Stated Goals  To know what to do to maintain her current function.    Currently in Pain?  Yes    Pain Score  2    L lateral knee   Pain Onset  More than a month ago                               PT Education - 02/27/20 1438    Education Details  ther-ex    [employer]) Educated  Patient  Methods  Explanation;Demonstration;Tactile cues;Verbal cues    Comprehension  Returned demonstration;Verbalized understanding          Objective    No latex band allergies  Palpation: R L5 TP more palpable compared to L and TTP. TTP R L 4 TP no TTP B piriformis muscle area. L ASIS more anterior  Supine position: L LE longer   Decreased B femoral control with ascending and descending stairs  No bilateral posterior hip and low back pain for the past 5 days (02/27/2020)    MedbridgeAccess Code [email or web address]  Therapeutic exercise  Sitting position: manual IR of L tibia. Decreased L lateral knee symptoms.   Standing posture. B foot pronation Decreased L lateral knee pain with supporting arch  Gait 32 ft x 2 after low dye taping to support L foot arch. Minimal to no L lateral thigh and knee pain.     Pt was recommended to get arch supports for her feet to promote better closed chain knee mechanics. Pt demonstrated an verbalized understanding.    Seated hip ER 10x5 seconds for 3  sets  Seated hip adduction pillow squeeze isometrics 10x5 seconds for 3 sets    Improved exercise technique, movement at target joints, use of target muscles after min to mod verbal, visual, tactile cues.    Manual therapy  Low dye tape to support L plantar arch Seated STM L lateral hamstrings      Response to treatment Decreased L lateral knee pain in closed chain positions with treatment to decrease foot pronation.     Clinical impression Decreased L lateral thigh and knee pain with treatment to decrease L foot pronation, ER of tibia and decrease excessive lateral hamstrings use. Pt making good progress with decreased low back and bilateral posterior hip pain with reports of no pain in those areas for the past 5 days. Pt will benefit from continued skilled physical therapy services to decrease pain, Improve strength and function.        PT Short Term Goals - 02/13/20 1854      PT SHORT TERM GOAL #1   Title  Patient will be independent with her HEP to decrease pain, improve strength, function, and ability to tolerate prolonged positions.    Baseline  Pt has started her HEP (02/13/20)    Time  3    Period  Weeks    Status  New    Target Date  03/08/20        PT Long Term Goals - 02/13/20 1857      PT LONG TERM GOAL #1   Title  Patient will have a decrease in low back and B posterior hip pain to 2/10 or less at worst to promote ability to transfer out of prolonged supine, prone, and sitting positions more comfortably.    Baseline  5/10 low back pain at most for the past month (02/13/20)    Time  6    Period  Weeks    Status  New    Target Date  03/29/20      PT LONG TERM GOAL #2   Title  Pt will improve bilateral hip strength by at least 1/2 MMT grade to promote ability to perform functional tasks.    Time  6    Period  Weeks    Status  New    Target Date  03/29/20      PT LONG TERM GOAL #3   Title  Pt will improve her lumbar FOTO score  by at least 10  points as a demonstration of improved function.    Baseline  Lumbar FOTO: 60 (02/13/20)    Time  6    Status  New    Target Date  03/29/20             Plan - 02/27/20 1439    Clinical Impression Statement  Decreased L lateral thigh and knee pain with treatment to decrease L foot pronation, ER of tibia and decrease excessive lateral hamstrings use.  Pt making good progress with decreased low back and bilateral posterior hip pain with reports of no pain in those areas for the past 5 days. Pt will benefit from continued skilled physical therapy services to decrease pain, Improve strength and function.    Personal Factors and Comorbidities  Age;Fitness;Time since onset of injury/illness/exacerbation;Past/Current Experience;Profession    Examination-Activity Limitations  Sit;Transfers;Sleep;Stand;Squat    Stability/Clinical Decision Making  Stable/Uncomplicated    Rehab Potential  Fair    PT Frequency  2x / week    PT Duration  6 weeks    PT Treatment/Interventions  Therapeutic activities;Therapeutic exercise;Neuromuscular re-education;Patient/family education;Manual techniques;Dry needling;Aquatic Therapy;Electrical Stimulation;Iontophoresis 4mg /ml Dexamethasone    PT Next Visit Plan  thoracic extension, hip extension, trunk and glute strengthening, lumbopelvic/hip control, manual techniques, modalities PRN    PT Home Exercise Plan  Medbridge Access Code [email or web address]    Consulted and Agree with Plan of Care  Patient       Patient will benefit from skilled therapeutic intervention in order to improve the following deficits and impairments:  Pain, Postural dysfunction, Improper body mechanics, Decreased strength  Visit Diagnosis: Chronic bilateral low back pain, unspecified whether sciatica present  Muscle weakness (generalized)     Problem List Patient Active Problem List   Diagnosis Date Noted  . Vaginal atrophy 08/15/2019  . Menopause 06/01/2018  . Gluten intolerance 06/01/2018  .  Annual physical exam 05/04/2018  . Overweight (BMI 25.0-29.9) 05/04/2018  . Barrett's esophagus without dysplasia 05/04/2018  . Family history of hypothyroidism 05/04/2018  . Hyperlipidemia 03/30/2018  . Skin lesion of face 05/13/2017  . Carpal tunnel syndrome 01/08/2016  . Mass of thigh 02/16/2015  . Lesion of neck 02/16/2015  . Sebaceous cyst 05/01/2014    Joneen Boers PT, DPT   02/27/2020, 2:49 PM  [facility] [address], [place], [address] Phone: [phone]   Fax:  [phone]  Name: BAUDELIA PIERCEFIELD MRN: [id number] Date of Birth: 1960/12/20

## 2020-03-01 ENCOUNTER — Other Ambulatory Visit: Payer: Self-pay

## 2020-03-01 ENCOUNTER — Ambulatory Visit: Payer: No Typology Code available for payment source

## 2020-03-01 DIAGNOSIS — M545 Low back pain, unspecified: Secondary | ICD-10-CM

## 2020-03-01 DIAGNOSIS — G8929 Other chronic pain: Secondary | ICD-10-CM

## 2020-03-01 DIAGNOSIS — M6281 Muscle weakness (generalized): Secondary | ICD-10-CM

## 2020-03-01 NOTE — Therapy (Signed)
[facility] [address] [place], [address] Phone: [phone]   Fax:  [phone]  Physical Therapy Treatment  Patient Details  Name: Sarah Dennis MRN: [id number] Date of Birth: 1961/08/25 Referring Provider (PT): Marin Olp, [place]   Encounter Date: 03/01/2020  PT End of Session - 03/01/20 1519    Visit Number  6    Number of Visits  13    Date for PT Re-Evaluation  03/29/20    PT Start Time  1519    PT Stop Time  1559    PT Time Calculation (min)  40 min    Activity Tolerance  Patient tolerated treatment well    Behavior During Therapy  [facility] for tasks assessed/performed       Past Medical History:  Diagnosis Date  . Arthritis   . Barrett's esophagus   . Carpal tunnel syndrome 01/08/2016   Right  . Esophagitis   . Esophagitis    eosinophillic   . GERD (gastroesophageal reflux disease)   . Hyperlipidemia   . Shingles    left eye x 2     Past Surgical History:  Procedure Laterality Date  . CESAREAN SECTION  1988  . COLONOSCOPY  07/2013   Dr. Vira Agar  . mole removal  15 yaers ago  . [place]     2007  . TUBAL LIGATION  2000  . VEIN SURGERY  2007,2012    There were no vitals filed for this visit.  Subjective Assessment - 03/01/20 1520    Subjective  Doing good. L knee and leg is still swollen. Still has L lateral hamstrings pull. Has an arch support at home. 3/10 R posterior knee (sciatic nerve) pull when walking currently with arch support. Had back pain when doing the S/L hip abduction a couple of nights ago.  The random pull in her spine is better. Now rarely feels the posterior bilateral hip pain.   5/10 L lateral thigh pain, 5/10 low back and posterior hip pain (does not last as long as before) for the past 7 days.    Pertinent History  Low back pain. 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 Pt was told that  L3,4,5 is affected. Wants to know what she needs to do to maintain her function. Does squats, planks, bridge, seated trunk rotation, side bending. Does not feel the back pain until the day after. Works 3 twleve hour shifts. Back bothers her towards the end of her shift. Feels like walking around, sitting, maybe leaning forward might bother her back. The muscle relaxer she uses at night which made a big difference. The baclofen helps her sleep at night. Pain radiates to both posterior hips. Went to the chiropractor which helped but felt like she did not get anywhere.  Pt states that she is not hurting constantly.  Had a back flare up December 2020 after prolonged sitting (had to go to [place]).    Patient Stated Goals  To know what to do to maintain her current function.    Currently in Pain?  Yes    Pain Score  3     Pain Onset  More than a month ago  PT Education - 03/01/20 1531    Education Details  ther-ex    Person(s) Educated  Patient    Methods  Explanation;Demonstration;Tactile cues;Verbal cues    Comprehension  Returned demonstration;Verbalized understanding        Objective    No latex band allergies  Palpation: R L5 TP more palpable compared to L and TTP. TTP R L 4 TP no TTP B piriformis muscle area. L ASIS more anterior  Supine position: L LE longer   Decreased B femoral control with ascending and descending stairs  No bilateral posterior hip and low back pain for the past 5 days (02/27/2020)    MedbridgeAccess Code [email or web address]  Therapeutic exercise  Seated L knee flexion targeting the medial hamstrings green band 10x3  Reviewed and given as part of her HEP. Pt demonstrated and verbalized understanding. Handout provided.    Seated hip ER 10x5 seconds for 3 sets  Seated hip adduction ball and glute max squeeze isometrics 10x5 seconds for 3 sets   S/L hip abduction, cues for no hip hike   R  10x2  L 10x2   Supine hip extension (knee straight with pillow and towel roll)  isometrics with contralateral LE in hooklying,   L 10x5 seconds for 3 sets  R 10x5 seconds for 3 sets  Supine posterior pelvic tilt 10x5 seconds for 3 sets  Cues needed for pt to perform properly.    Improved exercise technique, movement at target joints, use of target muscles after min to mod verbal, visual, tactile cues.      Response to treatment Decreased L lateral knee pain in closed chain positions with treatment to decrease foot pronation.     Clinical impression Continued working on decreassing L tibial ER, improving glute strength, trunk strength to promote better lumbar and knee mechanics to help decrease pain. Pt tolerated session well without aggravation of symptoms. Pt will benefit from continued skilled physical therapy services to decrease pain, improve ROM, strength and function.     PT Short Term Goals - 02/13/20 1854      PT SHORT TERM GOAL #1   Title  Patient will be independent with her HEP to decrease pain, improve strength, function, and ability to tolerate prolonged positions.    Baseline  Pt has started her HEP (02/13/20)    Time  3    Period  Weeks    Status  New    Target Date  03/08/20        PT Long Term Goals - 02/13/20 1857      PT LONG TERM GOAL #1   Title  Patient will have a decrease in low back and B posterior hip pain to 2/10 or less at worst to promote ability to transfer out of prolonged supine, prone, and sitting positions more comfortably.    Baseline  5/10 low back pain at most for the past month (02/13/20)    Time  6    Period  Weeks    Status  New    Target Date  03/29/20      PT LONG TERM GOAL #2   Title  Pt will improve bilateral hip strength by at least 1/2 MMT grade to promote ability to perform functional tasks.    Time  6    Period  Weeks    Status  New    Target Date  03/29/20      PT LONG TERM GOAL #3   Title  Pt will improve  her lumbar FOTO score by at least 10 points as a demonstration of improved function.    Baseline  Lumbar FOTO: 60 (02/13/20)    Time  6    Status  New    Target Date  03/29/20            Plan - 03/01/20 1533    Clinical Impression Statement  Continued working on decreassing L tibial ER, improving glute strength, trunk strength to promote better lumbar and knee mechanics to help decrease pain. Pt tolerated session well without aggravation of symptoms. Pt will benefit from continued skilled physical therapy services to decrease pain, improve ROM, strength and function.    Personal Factors and Comorbidities  Age;Fitness;Time since onset of injury/illness/exacerbation;Past/Current Experience;Profession    Examination-Activity Limitations  Sit;Transfers;Sleep;Stand;Squat    Stability/Clinical Decision Making  Stable/Uncomplicated    Rehab Potential  Fair    PT Frequency  2x / week    PT Duration  6 weeks    PT Treatment/Interventions  Therapeutic activities;Therapeutic exercise;Neuromuscular re-education;Patient/family education;Manual techniques;Dry needling;Aquatic Therapy;Electrical Stimulation;Iontophoresis 4mg /ml Dexamethasone    PT Next Visit Plan  thoracic extension, hip extension, trunk and glute strengthening, lumbopelvic/hip control, manual techniques, modalities PRN    PT Home Exercise Plan  Medbridge Access Code [email or web address]    Consulted and Agree with Plan of Care  Patient       Patient will benefit from skilled therapeutic intervention in order to improve the following deficits and impairments:  Pain, Postural dysfunction, Improper body mechanics, Decreased strength  Visit Diagnosis: Chronic bilateral low back pain, unspecified whether sciatica present  Muscle weakness (generalized)     Problem List Patient Active Problem List   Diagnosis Date Noted  . Vaginal atrophy 08/15/2019  . Menopause 06/01/2018  . Gluten intolerance 06/01/2018  . Annual physical exam 05/04/2018   . Overweight (BMI 25.0-29.9) 05/04/2018  . Barrett's esophagus without dysplasia 05/04/2018  . Family history of hypothyroidism 05/04/2018  . Hyperlipidemia 03/30/2018  . Skin lesion of face 05/13/2017  . Carpal tunnel syndrome 01/08/2016  . Mass of thigh 02/16/2015  . Lesion of neck 02/16/2015  . Sebaceous cyst 05/01/2014    Joneen Boers PT, DPT   03/01/2020, 5:16 PM  [facility] [address], [place], [address] Phone: [phone]   Fax:  [phone]  Name: NAEEMAH ROSSELOT MRN: [id number] Date of Birth: 1961-03-27

## 2020-03-01 NOTE — Patient Instructions (Signed)
Seated L knee flexion targeting the medial hamstrings green band 10x3  Reviewed and given as part of her HEP. Pt demonstrated and verbalized understanding. Handout provided.        Access Code: [email or web address] URL: [email or web address] Date: 03/01/2020 Prepared by: Joneen Boers  Exercises Supine Transversus Abdominis Bracing - Hands on Stomach - 1 x daily - 7 x weekly - 3 sets - 10 reps - 5 hold Bent Knee Fallouts - 1 x daily - 7 x weekly - 3 sets - 10 reps Supine Bridge - 1 x daily - 7 x weekly - 3 sets - 10 reps Sidelying Hip Abduction - 1 x daily - 7 x weekly - 2 sets - 10 reps Supine Posterior Pelvic Tilt - 1 x daily - 7 x weekly - 3 sets - 10 reps - 5 seconds hold

## 2020-03-05 ENCOUNTER — Other Ambulatory Visit: Payer: Self-pay

## 2020-03-05 ENCOUNTER — Ambulatory Visit: Payer: No Typology Code available for payment source

## 2020-03-05 DIAGNOSIS — G8929 Other chronic pain: Secondary | ICD-10-CM

## 2020-03-05 DIAGNOSIS — M545 Low back pain: Secondary | ICD-10-CM | POA: Diagnosis not present

## 2020-03-05 DIAGNOSIS — M6281 Muscle weakness (generalized): Secondary | ICD-10-CM

## 2020-03-05 NOTE — Therapy (Signed)
[facility] [address], [place], [address] Phone: [phone]   Fax:  [phone]  Physical Therapy Treatment  Patient Details  Name: Sarah Dennis MRN: [id number] Date of Birth: 04/16/1961 Referring Provider (PT): Marin Olp, [place]   Encounter Date: 03/05/2020  PT End of Session - 03/05/20 1618    Visit Number  7    Number of Visits  13    Date for PT Re-Evaluation  03/29/20    PT Start Time  1618    PT Stop Time  1702    PT Time Calculation (min)  44 min    Activity Tolerance  Patient tolerated treatment well    Behavior During Therapy  [facility] for tasks assessed/performed       Past Medical History:  Diagnosis Date  . Arthritis   . Barrett's esophagus   . Carpal tunnel syndrome 01/08/2016   Right  . Esophagitis   . Esophagitis    eosinophillic   . GERD (gastroesophageal reflux disease)   . Hyperlipidemia   . Shingles    left eye x 2     Past Surgical History:  Procedure Laterality Date  . CESAREAN SECTION  1988  . COLONOSCOPY  07/2013   Dr. Vira Agar  . mole removal  15 yaers ago  . [place]     2007  . TUBAL LIGATION  2000  . VEIN SURGERY  2007,2012    There were no vitals filed for this visit.  Subjective Assessment - 03/05/20 1619    Subjective  Much much better. Only feel her L hamstrings ocasionally when working long hours. Everything is much better with her orthotics.  0/10 (5/10 at worst, towards end of shift, goes away when sitting, no orthotics). No L lateral hamstrings pain when walking into clinic. No back pain with using orthotics after working 12 hours.    Pertinent History  Low back pain. 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 Pt was told that L3,4,5 is affected. Wants to know what she needs to do to maintain her function. Does squats, planks, bridge, seated trunk rotation, side bending. Does not feel  the back pain until the day after. Works 3 twleve hour shifts. Back bothers her towards the end of her shift. Feels like walking around, sitting, maybe leaning forward might bother her back. The muscle relaxer she uses at night which made a big difference. The baclofen helps her sleep at night. Pain radiates to both posterior hips. Went to the chiropractor which helped but felt like she did not get anywhere.  Pt states that she is not hurting constantly.  Had a back flare up December 2020 after prolonged sitting (had to go to [place]).    Patient Stated Goals  To know what to do to maintain her current function.    Currently in Pain?  No/denies    Pain Onset  More than a month ago         [facility] PT Assessment - 03/05/20 1623      Strength   Right Hip Flexion  4+/5    Right Hip Extension  5/5   seated manually resisted   Right Hip ABduction  4/5    Left Hip Flexion  4+/5    Left Hip Extension  5/5   seated manually resisted   Left Hip ABduction  4/5  PT Education - 03/05/20 1634    Education Details  ther-ex    Person(s) Educated  Patient    Methods  Explanation;Demonstration;Tactile cues;Verbal cues    Comprehension  Returned demonstration;Verbalized understanding      Objective    No latex band allergies  Palpation: R L5 TP more palpable compared to L and TTP. TTP R L 4 TP no TTP B piriformis muscle area. L ASIS more anterior  Supine position: L LE longer   Decreased B femoral control with ascending and descending stairs  No bilateral posterior hip and low back pain for the past 5 days (02/27/2020)    MedbridgeAccess Code [email or web address]  Therapeutic exercise   Seated manually resisted hip flexion, hip extension, S/L hip abduction 1-2x each way for each LE  Improved all planes except for R hip abduction   S/L hip abduction   R 2 lbs 10x2  L 2 lbs 10x   Supine hip extension (knee straight with pillow and  towel roll)  isometrics with contralateral LE in hooklying,              L 10x5 seconds for 3 sets             R 10x5 seconds for 3 sets  Seated hip ER 10x5 seconds for 3 sets  Seated hip adduction ball and glute max squeeze isometrics 10x5 seconds for 3 sets  Forward step up onto 4 inch step with emphasis on femoral control   R 10x3  L 10x3   Supine posterior pelvic tilt 10x5 seconds for 3 sets              Improved exercise technique, movement at target joints, use of target muscles aftermin tomod verbal, visual, tactile cues.     Response to treatment Pt tolerated session well without aggravation of symptoms.      Clinical impression Pt improving low back, and L LE pain level with wearing her orthotics during her long hours or work. Continued working on trunk, glute strengthening and femoral control to decrease stress to low back, L lateral hamstrings, and L knee. Pt tolerated session well without aggravation of symptoms. Pt will benefit from continued skilled physical therapy services to decrease pain, improve strength, function, and ability to work long shifts more comfortably.      PT Short Term Goals - 02/13/20 1854      PT SHORT TERM GOAL #1   Title  Patient will be independent with her HEP to decrease pain, improve strength, function, and ability to tolerate prolonged positions.    Baseline  Pt has started her HEP (02/13/20)    Time  3    Period  Weeks    Status  New    Target Date  03/08/20        PT Long Term Goals - 02/13/20 1857      PT LONG TERM GOAL #1   Title  Patient will have a decrease in low back and B posterior hip pain to 2/10 or less at worst to promote ability to transfer out of prolonged supine, prone, and sitting positions more comfortably.    Baseline  5/10 low back pain at most for the past month (02/13/20)    Time  6    Period  Weeks    Status  New    Target Date  03/29/20      PT LONG TERM GOAL #2   Title  Pt will  improve bilateral hip strength by  at least 1/2 MMT grade to promote ability to perform functional tasks.    Time  6    Period  Weeks    Status  New    Target Date  03/29/20      PT LONG TERM GOAL #3   Title  Pt will improve her lumbar FOTO score by at least 10 points as a demonstration of improved function.    Baseline  Lumbar FOTO: 60 (02/13/20)    Time  6    Status  New    Target Date  03/29/20            Plan - 03/05/20 1640    Clinical Impression Statement  Pt improving low back, and L LE pain level with wearing her orthotics during her long hours or work. Continued working on trunk, glute strengthening and femoral control to decrease stress to low back, L lateral hamstrings, and L knee. Pt tolerated session well without aggravation of symptoms. Pt will benefit from continued skilled physical therapy services to decrease pain, improve strength, function, and ability to work long shifts more comfortably.    Personal Factors and Comorbidities  Age;Fitness;Time since onset of injury/illness/exacerbation;Past/Current Experience;Profession    Examination-Activity Limitations  Sit;Transfers;Sleep;Stand;Squat    Stability/Clinical Decision Making  Stable/Uncomplicated    Rehab Potential  Fair    PT Frequency  2x / week    PT Duration  6 weeks    PT Treatment/Interventions  Therapeutic activities;Therapeutic exercise;Neuromuscular re-education;Patient/family education;Manual techniques;Dry needling;Aquatic Therapy;Electrical Stimulation;Iontophoresis 4mg /ml Dexamethasone    PT Next Visit Plan  thoracic extension, hip extension, trunk and glute strengthening, lumbopelvic/hip control, manual techniques, modalities PRN    PT Home Exercise Plan  Medbridge Access Code [email or web address]    Consulted and Agree with Plan of Care  Patient       Patient will benefit from skilled therapeutic intervention in order to improve the following deficits and impairments:  Pain, Postural dysfunction, Improper body  mechanics, Decreased strength  Visit Diagnosis: Chronic bilateral low back pain, unspecified whether sciatica present  Muscle weakness (generalized)     Problem List Patient Active Problem List   Diagnosis Date Noted  . Vaginal atrophy 08/15/2019  . Menopause 06/01/2018  . Gluten intolerance 06/01/2018  . Annual physical exam 05/04/2018  . Overweight (BMI 25.0-29.9) 05/04/2018  . Barrett's esophagus without dysplasia 05/04/2018  . Family history of hypothyroidism 05/04/2018  . Hyperlipidemia 03/30/2018  . Skin lesion of face 05/13/2017  . Carpal tunnel syndrome 01/08/2016  . Mass of thigh 02/16/2015  . Lesion of neck 02/16/2015  . Sebaceous cyst 05/01/2014    Joneen Boers PT, DPT     03/05/2020, 5:14 PM  [facility] [address], [place], [address] Phone: [phone]   Fax:  [phone]  Name: PARNELL ELDER MRN: [id number] Date of Birth: 04-17-1961

## 2020-03-12 ENCOUNTER — Ambulatory Visit: Payer: No Typology Code available for payment source

## 2020-03-14 ENCOUNTER — Encounter: Payer: Self-pay | Admitting: Obstetrics and Gynecology

## 2020-04-09 ENCOUNTER — Encounter (INDEPENDENT_AMBULATORY_CARE_PROVIDER_SITE_OTHER): Payer: No Typology Code available for payment source | Admitting: Internal Medicine

## 2020-04-09 DIAGNOSIS — M5416 Radiculopathy, lumbar region: Secondary | ICD-10-CM

## 2020-04-09 DIAGNOSIS — M62830 Muscle spasm of back: Secondary | ICD-10-CM

## 2020-04-13 ENCOUNTER — Other Ambulatory Visit: Payer: Self-pay | Admitting: Internal Medicine

## 2020-04-13 DIAGNOSIS — M5416 Radiculopathy, lumbar region: Secondary | ICD-10-CM

## 2020-04-13 DIAGNOSIS — M62830 Muscle spasm of back: Secondary | ICD-10-CM

## 2020-04-13 MED ORDER — BACLOFEN 10 MG PO TABS: 10.0000 mg | ORAL_TABLET | Freq: Every evening | ORAL | 3 refills | Status: DC | PRN

## 2020-04-13 NOTE — Telephone Encounter (Signed)
I totally understand and will be fine for a my chart charge. I don't plan on continuing to see Neuro just for prescription refill. Do I need to call and give payment info for this charge?   You  Dara Lords M 2 days ago  TM This is a new medication that I will be prescribing either an appointment or a charge via my chart for this. Since they are recommending you take this Dr. Cari Caraway can Prescribe this if it is deemed needed by this clinic and if this is a continuation. Please contact then to prescribe he can fill this for you.   I know you dont abuse but any new medication I will be Rx Ive never requires an appointment or my chart fee   Let me know if you have trouble getting from them and we can do above options    Thressa Sheller, CMA routed conversation to You 2 days ago  [place] 3 days ago   Yes Estill Bamberg with Neuro has left the practice. So was hoping you could continue to fill this prescription for my chronic back pain. I don't abuse this when taking sparingly. Do I need to make appointment with you in order for you to prescribe muscle relaxer? Thanks, [place] M 3 days ago  TM This was filled by Neurosurgery Dr. Cari Caraway or the PA Marin Olp Please contact them for refills  [facility]  [place], [place] [address]  [place], [place], [place]  [place], [place] [address]  [phone]  [phone] (Fax)   Thank you TMS   Thressa Sheller, CMA  You 4 days ago   Please advise Does this need consult fee? Patient last seen for physical 08/10/2019.   Routing comment   [place], Like  You 4 days ago   Dr Linus Orn, Rene Paci completed 4 weeks of physical therapy and continue with the exercises given. Although on occasion after working 12 hrs my back pain is relentless. The baclofen has been awesome to use as needed at night. Can you please prescribe for me? Thanks, Dara Lords  1.  Lumbar radiculopathy/muscle spasm  Rx baclofen 10 mg qhs prn cont form NS wants PCP to takeover and declines NS f/u for now   Agree to fee  [place] Time 5-10 minutes

## 2020-05-15 ENCOUNTER — Encounter: Payer: Self-pay | Admitting: Internal Medicine

## 2020-05-21 ENCOUNTER — Other Ambulatory Visit: Payer: Self-pay | Admitting: Internal Medicine

## 2020-05-21 DIAGNOSIS — K22719 Barrett's esophagus with dysplasia, unspecified: Secondary | ICD-10-CM

## 2020-05-21 MED ORDER — PANTOPRAZOLE SODIUM 40 MG PO TBEC: 40.0000 mg | DELAYED_RELEASE_TABLET | Freq: Every day | ORAL | 3 refills | Status: DC

## 2020-05-29 ENCOUNTER — Encounter: Payer: Self-pay | Admitting: Obstetrics and Gynecology

## 2020-07-30 ENCOUNTER — Other Ambulatory Visit: Payer: Self-pay | Admitting: Internal Medicine

## 2020-07-30 DIAGNOSIS — R112 Nausea with vomiting, unspecified: Secondary | ICD-10-CM

## 2020-07-30 MED ORDER — METOCLOPRAMIDE HCL 10 MG PO TABS: 10.0000 mg | ORAL_TABLET | Freq: Every day | ORAL | 1 refills | Status: DC | PRN

## 2020-08-03 ENCOUNTER — Encounter: Payer: Self-pay | Admitting: Obstetrics and Gynecology

## 2020-08-10 ENCOUNTER — Encounter: Payer: No Typology Code available for payment source | Admitting: Internal Medicine

## 2020-08-17 ENCOUNTER — Encounter: Payer: No Typology Code available for payment source | Admitting: Internal Medicine

## 2020-08-23 ENCOUNTER — Encounter: Payer: Self-pay | Admitting: Internal Medicine

## 2020-08-23 ENCOUNTER — Ambulatory Visit (INDEPENDENT_AMBULATORY_CARE_PROVIDER_SITE_OTHER): Payer: No Typology Code available for payment source | Admitting: Internal Medicine

## 2020-08-23 ENCOUNTER — Telehealth: Payer: Self-pay | Admitting: Internal Medicine

## 2020-08-23 ENCOUNTER — Other Ambulatory Visit: Payer: Self-pay

## 2020-08-23 VITALS — BP 130/86 | HR 73 | Temp 98.2°F | Ht 68.11 in | Wt 194.8 lb

## 2020-08-23 DIAGNOSIS — B852 Pediculosis, unspecified: Secondary | ICD-10-CM

## 2020-08-23 DIAGNOSIS — R198 Other specified symptoms and signs involving the digestive system and abdomen: Secondary | ICD-10-CM

## 2020-08-23 DIAGNOSIS — Z1329 Encounter for screening for other suspected endocrine disorder: Secondary | ICD-10-CM | POA: Diagnosis not present

## 2020-08-23 DIAGNOSIS — E785 Hyperlipidemia, unspecified: Secondary | ICD-10-CM | POA: Diagnosis not present

## 2020-08-23 DIAGNOSIS — Z1389 Encounter for screening for other disorder: Secondary | ICD-10-CM | POA: Diagnosis not present

## 2020-08-23 DIAGNOSIS — B850 Pediculosis due to Pediculus humanus capitis: Secondary | ICD-10-CM

## 2020-08-23 DIAGNOSIS — K2270 Barrett's esophagus without dysplasia: Secondary | ICD-10-CM

## 2020-08-23 DIAGNOSIS — Z0000 Encounter for general adult medical examination without abnormal findings: Secondary | ICD-10-CM | POA: Diagnosis not present

## 2020-08-23 DIAGNOSIS — E663 Overweight: Secondary | ICD-10-CM

## 2020-08-23 DIAGNOSIS — Z1231 Encounter for screening mammogram for malignant neoplasm of breast: Secondary | ICD-10-CM

## 2020-08-23 DIAGNOSIS — Z23 Encounter for immunization: Secondary | ICD-10-CM | POA: Diagnosis not present

## 2020-08-23 LAB — LIPID PANEL
Cholesterol: 238 mg/dL — ABNORMAL HIGH (ref 0–200)
HDL: 74 mg/dL (ref >39.00)
LDL Cholesterol: 153 mg/dL — ABNORMAL HIGH (ref 0–99)
NonHDL: 163.59
Total CHOL/HDL Ratio: 3
Triglycerides: 55 mg/dL (ref 0.0–149.0)
VLDL: 11 mg/dL (ref 0.0–40.0)

## 2020-08-23 LAB — COMPREHENSIVE METABOLIC PANEL
ALT: 25 U/L (ref 0–35)
AST: 17 U/L (ref 0–37)
Albumin: 4.7 g/dL (ref 3.5–5.2)
Alkaline Phosphatase: 45 U/L (ref 39–117)
BUN: 19 mg/dL (ref 6–23)
CO2: 26 mEq/L (ref 19–32)
Calcium: 9.5 mg/dL (ref 8.4–10.5)
Chloride: 102 mEq/L (ref 96–112)
Creatinine, Ser: 0.78 mg/dL (ref 0.40–1.20)
GFR: 76.1 mL/min (ref >60.00)
Glucose, Bld: 92 mg/dL (ref 70–99)
Potassium: 4.1 mEq/L (ref 3.5–5.1)
Sodium: 139 mEq/L (ref 135–145)
Total Bilirubin: 0.8 mg/dL (ref 0.2–1.2)
Total Protein: 7.4 g/dL (ref 6.0–8.3)

## 2020-08-23 LAB — CBC WITH DIFFERENTIAL/PLATELET
Basophils Absolute: 0 10*3/uL (ref 0.0–0.1)
Basophils Relative: 0.6 % (ref 0.0–3.0)
Eosinophils Absolute: 0.1 10*3/uL (ref 0.0–0.7)
Eosinophils Relative: 1.2 % (ref 0.0–5.0)
HCT: 42.3 % (ref 36.0–46.0)
Hemoglobin: 14.4 g/dL (ref 12.0–15.0)
Lymphocytes Relative: 24.7 % (ref 12.0–46.0)
Lymphs Abs: 1.5 10*3/uL (ref 0.7–4.0)
MCHC: 34.1 g/dL (ref 30.0–36.0)
MCV: 90.3 fl (ref 78.0–100.0)
Monocytes Absolute: 0.5 10*3/uL (ref 0.1–1.0)
Monocytes Relative: 7.7 % (ref 3.0–12.0)
Neutro Abs: 3.9 10*3/uL (ref 1.4–7.7)
Neutrophils Relative %: 65.8 % (ref 43.0–77.0)
Platelets: 183 10*3/uL (ref 150.0–400.0)
RBC: 4.69 Mil/uL (ref 3.87–5.11)
RDW: 14.4 % (ref 11.5–15.5)
WBC: 5.9 10*3/uL (ref 4.0–10.5)

## 2020-08-23 LAB — TSH: TSH: 2.11 u[IU]/mL (ref 0.35–4.50)

## 2020-08-23 MED ORDER — PHENTERMINE HCL 37.5 MG PO TABS: 18.2500 mg | ORAL_TABLET | Freq: Every day | ORAL | 0 refills | Status: DC

## 2020-08-23 MED ORDER — PERMETHRIN 1 % EX LIQD: Freq: Once | CUTANEOUS | 0 refills | Status: AC

## 2020-08-23 NOTE — Addendum Note (Signed)
Addended by: Nanci Pina on: 08/23/2020 03:16 PM   Modules accepted: Orders

## 2020-08-23 NOTE — Progress Notes (Signed)
Chief Complaint  Patient presents with   Annual Exam   Annual  1. Overweight wants to get back on adipex  2. grandaughter 57 y.o with h/o head lice recurrent and she thinks she has head lice and wants medication  3. Declines covid shot for now wants Tdap today  4. barretts due for EGD 09/2020 [facility]   Review of Systems  Constitutional: Negative for weight loss.  HENT: Negative for hearing loss.   Eyes: Negative for blurred vision.  Respiratory: Negative for shortness of breath.   Cardiovascular: Negative for chest pain.  Gastrointestinal: Negative for abdominal pain.  Musculoskeletal: Negative for falls and joint pain.  Skin: Negative for rash.  Psychiatric/Behavioral: Negative for depression.       Family stressors+   Past Medical History:  Diagnosis Date   Arthritis    Barrett's esophagus    Carpal tunnel syndrome 01/08/2016   Right   Esophagitis    Esophagitis    eosinophillic    GERD (gastroesophageal reflux disease)    Hyperlipidemia    Shingles    left eye x 2    Past Surgical History:  Procedure Laterality Date   CESAREAN SECTION  1988   COLONOSCOPY  07/2013   Dr. Vira Agar   mole removal  71 yaers ago   [place]     2007   [place]  2007,2012   Family History  Problem Relation Age of Onset   Cancer Mother        pancreatitic cancer    Hypothyroidism Mother    Goiter Mother    Depression Father    Dementia Father    Bipolar disorder Father    Hypothyroidism Father    Goiter Father    Depression Sister    Hyperlipidemia Sister    Hyperlipidemia Brother    Hypothyroidism Brother    Heart disease Paternal Grandfather        MI   Breast cancer Neg Hx    Social History   Socioeconomic History   Marital status: Married    Spouse name: Not on file   Number of children: 2   Years of education: Not on file   Highest education level: Not on file  Occupational History   Not on file   Tobacco Use   Smoking status: Never Smoker   Smokeless tobacco: Never Used  Substance and Sexual Activity   Alcohol use: Yes    Comment: occasionally   Drug use: No   Sexual activity: Yes    Comment: men  Other Topics Concern   Not on file  Social History Narrative   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 [employer]    No guns    Wears selt belt    Safe in relationship    2 kids    Married    Social Determinants of Health   Financial Resource Strain:    Difficulty of Paying Living Expenses: Not on file  Food Insecurity:    Worried About Charity fundraiser in the Last Year: Not on file   [employer] of Food in the Last Year: Not on file  Transportation Needs:    Lack of Transportation (Medical): Not on file   Lack of Transportation (Non-Medical): Not on file  Physical Activity:    Days of Exercise per Week: Not on file   Minutes of Exercise per Session: Not on file  Stress:    Feeling of Stress : Not on  file  Social Connections:    Frequency of Communication with Friends and Family: Not on file   Frequency of Social Gatherings with Friends and Family: Not on file   Attends Religious Services: Not on Electrical engineer or Organizations: Not on file   Attends Archivist Meetings: Not on file   Marital Status: Not on file  Intimate Partner Violence:    Fear of Current or Ex-Partner: Not on file   Emotionally Abused: Not on file   Physically Abused: Not on file   Sexually Abused: Not on file   Current Meds  Medication Sig   baclofen (LIORESAL) 10 MG tablet Take 1 tablet (10 mg total) by mouth at bedtime as needed.   doxylamine, Sleep, (UNISOM) 25 MG tablet Take 25 mg by mouth at bedtime.   estradiol (ESTRING) 2 MG vaginal ring Place 0.05 mg vaginally every 3 (three) months. follow package directions   estradiol (ESTRING) 2 MG vaginal ring Place 0.05 mg vaginally every 3 (three) months. follow package directions   MAGNESIUM PO Take 500 mg by  mouth 2 (two) times daily.   metoCLOPramide (REGLAN) 10 MG tablet Take 1 tablet (10 mg total) by mouth daily as needed for nausea.   MILK THISTLE PO Take by mouth.   pantoprazole (PROTONIX) 40 MG tablet Take 1 tablet (40 mg total) by mouth daily. Before food 30 minutes   [DISCONTINUED] phentermine 15 MG capsule Take 1 capsule (15 mg total) by mouth every morning.   Allergies  Allergen Reactions   Corn Starch Nausea And Vomiting   Gluten Meal Nausea And Vomiting   Percocet [Oxycodone-Acetaminophen] Itching   No results found for this or any previous visit (from the past 2160 hour(s)). Objective  Body mass index is 29.52 kg/m. Wt Readings from Last 3 Encounters:  08/23/20 194 lb 12.8 oz (88.4 kg)  08/10/19 177 lb (80.3 kg)  09/30/18 172 lb 4 oz (78.1 kg)   Temp Readings from Last 3 Encounters:  08/23/20 98.2 F (36.8 C) (Oral)  08/10/19 (!) 97.4 F (36.3 C) (Temporal)  09/30/18 98.3 F (36.8 C) (Oral)   BP Readings from Last 3 Encounters:  08/23/20 130/86  08/10/19 122/70  09/30/18 110/80   Pulse Readings from Last 3 Encounters:  08/23/20 73  08/10/19 72  09/30/18 72    Physical Exam Vitals and nursing note reviewed.  Constitutional:      Appearance: Normal appearance. She is well-developed, well-groomed and overweight.  HENT:     Head: Normocephalic and atraumatic.  Eyes:     Conjunctiva/sclera: Conjunctivae normal.     Pupils: Pupils are equal, round, and reactive to light.  Cardiovascular:     Rate and Rhythm: Normal rate and regular rhythm.     Heart sounds: Normal heart sounds. No murmur heard.   Pulmonary:     Effort: Pulmonary effort is normal.     Breath sounds: Normal breath sounds.  Skin:    General: Skin is warm and dry.  Neurological:     General: No focal deficit present.     Mental Status: She is alert and oriented to person, place, and time. Mental status is at baseline.     Gait: Gait normal.  Psychiatric:        Attention and  Perception: Attention and perception normal.        Mood and Affect: Mood and affect normal.        Speech: Speech normal.  Behavior: Behavior normal. Behavior is cooperative.        Thought Content: Thought content normal.        Cognition and Memory: Cognition normal.        Judgment: Judgment normal.     Assessment  Plan  Annual physical exam - Plan: Comprehensive metabolic panel, Lipid panel, CBC with Differential/Platelet, TSH,  Fasting labs today  Flu had shot 2021 at work will get  covid 19 using religious exemption  Tdaphad 03/03/10 given today  Had shingrix 2/2 08/18/18 Protected MMRtiter checked 03/31/08 immune, hep Bimmune titer 1028 3/19/1 no hep C or HIV quantiferon gold neg 03/31/08 Varicella immune 03/31/08  mammo neg 12/22/20call to schedule   07/23/17 pap from Dr. Cherylann Banas ob/gyn normal  --Dr/ Marcelline Mates est appt upcoming 2021  DEXA 01/12/14 normal   [facility] 08/23[facility]13 ext/int hemorrhoids repeat in 10 years EGD + barretts since 2014 last EGD 11/6/18see abovedue 09/2020 referred [facility]   rec healthy diet choices and exercise  Does not f/u dermatologyDr. Bary Castilla bx skin lesion or ln2 No derm needed for now 08/23/20    Hyperlipidemia, unspecified hyperlipidemia type - Plan: Lipid panel  Overweight (BMI 25.0-29.9) - Plan: phentermine (ADIPEX-P) 37.5 MG tablet will cut pill in 1/2  Abnormal findings on esophagogastroduodenoscopy (EGD) - Plan: Ambulatory referral to Gastroenterology Dr. Alice Reichert EGD  Barrett's esophagus without dysplasia - Plan: Ambulatory referral to Gastroenterology  Lice infested hair - Plan: permethrin (NIX) 1 % liquid  Provider: Dr. Olivia Mackie [facility]

## 2020-08-23 NOTE — Patient Instructions (Signed)
Lice, Adult     Lice are tiny insects with claws on the ends of their legs. They are small parasites that live on the human body. A parasite is an insect that lives off another animal and cannot survive without it. Lice often make their home in a person's hair, such as hair on the head or in the pubic area. Pubic lice are sometimes referred to as crabs. Lice hatch from little round eggs, which are attached to the base of hairs. Lice eggs are also called nits. Lice can spread from one person to another. Lice crawl. They do not fly or jump. Lice cause skin irritation and itching in the area of the infested hair. Although having lice can be annoying, it is not dangerous. Lice do not spread diseases. Treatment will usually clear up the symptoms within a few days. What are the causes? This condition may be caused by:  Having very close contact with an infested person.  Sharing infested items that touch your skin and hair. These include personal items, such as hats, combs, brushes, towels, clothing, pillowcases, or sheets. Pubic lice are spread through sexual contact. What increases the risk? Although having lice is more common among young children, anyone can get lice. Lice tend to thrive in warm weather, so that type of weather increases the risk. What are the signs or symptoms? Symptoms of this condition include:  Itchiness in the affected area.  Skin irritation.  Feeling of something moving in the hair.  Rash or sores on the skin.  Tiny flakes or sacs near the scalp. These may be white, yellow, or tan.  Tiny bugs crawling on the hair or scalp. How is this diagnosed? This condition is diagnosed based on:  Your symptoms.  A physical exam. ? Your health care provider will examine the affected area closely for live lice, tiny eggs (nits), and empty egg cases. ? Eggs are typically yellow or tan in color. Empty egg cases are whitish. Lice are gray or brown. How is this  treated? Treatment for this condition includes:  Using a hair rinse that contains a mild insecticide to kill lice. Your health care provider will recommend a prescription or over-the-counter rinse.  Removing lice, eggs, and egg cases by using a comb or tweezers.  Washing and bagging your clothing and bedding. Pregnant women should not use medicated shampoo or cream without first talking to their health care provider. Follow these instructions at home: Using medicated rinse Apply medicated rinse as told by your health care provider. Follow the label instructions carefully. General instructions for applying rinses may include these steps: 1. Put on an old shirt or underwear, or use an old towel in case of staining from the rinse. 2. Wash and towel-dry your head or pubic area before applying the rinse if directed to do so. 3. When your hair is dry, apply the rinse. Leave the rinse in your hair for the amount of time specified in the instructions. 4. Rinse the area with water. 5. Comb your wet hair with a fine-tooth comb. Comb it close to the skin and down to the ends, removing any lice, eggs, or egg cases. A lice comb may be included with the medicated rinse. 6. Do not wash the infested hair for 2 days while the medicine kills the lice. 7. After the treatment, repeat combing out your hair and removing lice, eggs, or egg cases from the hair every 2-3 days. Do this for about 2-3 weeks. After treatment, the  remaining lice should be moving more slowly. 8. Repeat the treatment if necessary in 7-10 days. General instructions  Remove any remaining lice, eggs, or egg cases using a fine-tooth comb.  Use hot water to wash all towels, hats, scarves, jackets, bedding, and clothing that you have recently used.  Put any non-washable items that may have been exposed into plastic bags. Keep the bags closed for 2 weeks.  Soak all combs and brushes in hot water for 10 minutes.  Vacuum furniture to remove  any loose hair. There is no need to use chemicals, which can be poisonous (toxic). Lice survive for only 1-2 days away from human skin. Eggs may survive for only 1 week.  For pubic lice, tell any sexual partners to seek treatment.  For head lice, ask your health care provider if other family members or close contacts should be examined or treated as well.  Keep all follow-up visits as told by your health care provider. This is important. Contact a health care provider if:  You develop sores that look infected.  Your rash or sores do not go away in 1 week.  The lice or eggs return or do not go away in spite of treatment. Summary  Lice are tiny parasitic insects that live on the human body. A parasite is an insect that lives off another animal and cannot survive without it.  Lice can spread from one person to another through close contact with an infested person or by sharing personal items, such as combs, brushes, or hats.  Lice can be treated with a medicated rinse. Follow your health care provider's instructions, or instructions on the label, if you are being treated with this medicine.  Ask your health care provider if your family members or close contacts should be treated for lice. This information is not intended to replace advice given to you by your health care provider. Make sure you discuss any questions you have with your health care provider. Document Revised: 04/22/2018 Document Reviewed: 11/19/2016 Elsevier Patient Education  2020 [employer].

## 2020-08-23 NOTE — Telephone Encounter (Signed)
Patient saw Dr. Olivia Mackie today. She wrote a prescribtion for 5% and the pharmacy has permethrin (NIX) 1 % liquid. Which % does Dr. Aundra Dubin want her to have.?

## 2020-08-24 ENCOUNTER — Other Ambulatory Visit: Payer: Self-pay | Admitting: Internal Medicine

## 2020-08-24 ENCOUNTER — Encounter: Payer: Self-pay | Admitting: Internal Medicine

## 2020-08-24 DIAGNOSIS — B850 Pediculosis due to Pediculus humanus capitis: Secondary | ICD-10-CM

## 2020-08-24 LAB — URINALYSIS, ROUTINE W REFLEX MICROSCOPIC
Bilirubin Urine: NEGATIVE
Glucose, UA: NEGATIVE
Hgb urine dipstick: NEGATIVE
Leukocytes,Ua: NEGATIVE
Nitrite: NEGATIVE
Protein, ur: NEGATIVE
Specific Gravity, Urine: 1.02 (ref 1.001–1.03)
pH: 5.5 (ref 5.0–8.0)

## 2020-08-24 MED ORDER — IVERMECTIN 0.5 % EX LOTN: 1.0000 "application " | TOPICAL_LOTION | Freq: Once | CUTANEOUS | 0 refills | Status: DC

## 2020-08-30 ENCOUNTER — Other Ambulatory Visit: Payer: Self-pay

## 2020-08-30 ENCOUNTER — Encounter: Payer: Self-pay | Admitting: Obstetrics and Gynecology

## 2020-08-30 ENCOUNTER — Other Ambulatory Visit (HOSPITAL_COMMUNITY)
Admission: RE | Admit: 2020-08-30 | Discharge: 2020-08-30 | Disposition: A | Discharge status: Home / Self Care | Payer: No Typology Code available for payment source | Source: Ambulatory Visit | Attending: Obstetrics and Gynecology | Admitting: Obstetrics and Gynecology

## 2020-08-30 ENCOUNTER — Ambulatory Visit (INDEPENDENT_AMBULATORY_CARE_PROVIDER_SITE_OTHER): Payer: No Typology Code available for payment source | Admitting: Obstetrics and Gynecology

## 2020-08-30 VITALS — BP 115/81 | HR 90 | Ht 68.11 in | Wt 190.9 lb

## 2020-08-30 DIAGNOSIS — E785 Hyperlipidemia, unspecified: Secondary | ICD-10-CM

## 2020-08-30 DIAGNOSIS — Z01419 Encounter for gynecological examination (general) (routine) without abnormal findings: Secondary | ICD-10-CM | POA: Diagnosis not present

## 2020-08-30 DIAGNOSIS — Z124 Encounter for screening for malignant neoplasm of cervix: Secondary | ICD-10-CM

## 2020-08-30 DIAGNOSIS — N951 Menopausal and female climacteric states: Secondary | ICD-10-CM | POA: Diagnosis not present

## 2020-08-30 DIAGNOSIS — Z7689 Persons encountering health services in other specified circumstances: Secondary | ICD-10-CM

## 2020-08-30 DIAGNOSIS — E663 Overweight: Secondary | ICD-10-CM

## 2020-08-30 NOTE — Progress Notes (Signed)
ANNUAL PREVENTATIVE CARE GYNECOLOGY  ENCOUNTER NOTE  Subjective:       Sarah Dennis is a 59 y.o. [phone] female here to establish care, and for a routine annual gynecologic exam. She was last seen by Dr. Georga Bora at [facility] in 2018.  Notes that due to insurance issues, she has had to find a new GYN. She has been seen by her PCP in the interim.The patient is sexually active. The patient is using local hormone replacement therapy (Estring, has been using for ~ 6 months for treatment of vaginal atrophy and discomfort with intercourse, doing well). Patient denies post-menopausal vaginal bleeding. The patient wears seatbelts: yes. The patient participates in regular exercise: yes (treaadmill, squats 3 days per week).   Current complaints: 1.  None. Notes that she is on a weight loss regimen with dietary modifications, exercising, and recently started Phentermine last week.  She has lost 4 lbs so far.    Gynecologic History No LMP recorded. Patient is postmenopausal. Contraception: post menopausal status Last Pap: 07/23/2017. Results were: normal Last mammogram: 12.22/2020. Results were: normal Last Colonoscopy: 08/23/2013. Results were: Constance Holster    Obstetric History OB History  Gravida Para Term Preterm AB Living  2 2 2     2   SAB TAB Ectopic Multiple Live Births          2    # Outcome Date GA Lbr Len/2nd Weight Sex Delivery Anes PTL Lv  2 Term 02/01/88    F Vag-Spont   LIV  1 Term 11/24/86    M CS-Vac   LIV    Obstetric Comments  1st Menstrual Cycle:  15  1st Pregnancy:  26    Past Medical History:  Diagnosis Date  . Arthritis   . Barrett's esophagus   . Carpal tunnel syndrome 01/08/2016   Right  . Esophagitis   . Esophagitis    eosinophillic   . GERD (gastroesophageal reflux disease)   . Hyperlipidemia   . Shingles    left eye x 2     Family History  Problem Relation Age of Onset  . Cancer Mother        pancreatitic cancer   . Hypothyroidism Mother     . Goiter Mother   . Depression Father   . Dementia Father   . Bipolar disorder Father   . Hypothyroidism Father   . Goiter Father   . Depression Sister   . Hyperlipidemia Sister   . Hyperlipidemia Brother   . Hypothyroidism Brother   . Heart disease Paternal Grandfather        MI  . Breast cancer Neg Hx     Past Surgical History:  Procedure Laterality Date  . CESAREAN SECTION  1988  . COLONOSCOPY  07/2013   Dr. Vira Agar  . mole removal  15 yaers ago  . [place]     2007  . TUBAL LIGATION  2000  . VEIN SURGERY  2007,2012    Social History   Socioeconomic History  . Marital status: Married    Spouse name: Not on file  . Number of children: 2  . Years of education: Not on file  . Highest education level: Not on file  Occupational History  . Not on file  Tobacco Use  . Smoking status: Never Smoker  . Smokeless tobacco: Never Used  Vaping Use  . Vaping Use: Never used  Substance and Sexual Activity  . Alcohol use: Yes    Comment: occasionally  .  Drug use: No  . Sexual activity: Yes    Comment: men  Other Topics Concern  . 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 Difficulty of Paying Living Expenses: Not on file  Food Insecurity:   . Worried About Charity fundraiser in the Last Year: Not on file  . Ran Out of Food in the Last Year: Not on file  Transportation Needs:   . Lack of Transportation (Medical): Not on file  . Lack of Transportation (Non-Medical): Not on file  Physical Activity:   . Days of Exercise per Week: Not on file  . Minutes of Exercise per Session: Not on file  Stress:   . Feeling of Stress : Not on file  Social Connections:   . Frequency of Communication with Friends and Family: Not on file  . Frequency of Social Gatherings with Friends and Family: Not on  file  . Attends Religious Services: Not on file  . Active Member of Clubs or Organizations: Not on file  . Attends Archivist Meetings: Not on file  . Marital Status: Not on file  Intimate Partner Violence:   . Fear of Current or Ex-Partner: Not on file  . Emotionally Abused: Not on file  . Physically Abused: Not on file  . Sexually Abused: Not on file    Current Outpatient Medications on File Prior to Visit  Medication Sig Dispense Refill  . baclofen (LIORESAL) 10 MG tablet Take 1 tablet (10 mg total) by mouth at bedtime as needed. 90 each 3  . doxylamine, Sleep, (UNISOM) 25 MG tablet Take 25 mg by mouth at bedtime.    Marland Kitchen estradiol (ESTRING) 2 MG vaginal ring Place 0.05 mg vaginally every 3 (three) months. follow package directions 1 each 3  . estradiol (ESTRING) 2 MG vaginal ring Place 0.05 mg vaginally every 3 (three) months. follow package directions 1 each 4  . MAGNESIUM PO Take 500 mg by mouth 2 (two) times daily.    . metoCLOPramide (REGLAN) 10 MG tablet Take 1 tablet (10 mg total) by mouth daily as needed for nausea. 90 tablet 1  . MILK THISTLE PO Take by mouth.    . pantoprazole (PROTONIX) 40 MG tablet Take 1 tablet (40 mg total) by mouth daily. Before food 30 minutes 90 tablet 3  . phentermine (ADIPEX-P) 37.5 MG tablet Take 0.5 tablets (18.75 mg total) by mouth daily before breakfast. 60 tablet 0   No current facility-administered medications on file prior to visit.    Allergies  Allergen Reactions  . Corn Starch Nausea And Vomiting  . Gluten Meal Nausea And Vomiting  . Percocet [Oxycodone-Acetaminophen] Itching      Review of Systems ROS Review of Systems - General ROS: negative for - chills, fatigue, fever, hot flashes, night sweats, weight gain. Positive for intentional weight loss (see HPI) Psychological ROS: negative for - anxiety, decreased libido, depression, mood swings, physical abuse or sexual abuse Ophthalmic ROS: negative for - blurry vision, eye  pain or loss of vision ENT ROS: negative for - headaches, hearing change, visual changes or vocal changes Allergy and Immunology ROS: negative for - hives, itchy/watery eyes or seasonal allergies Hematological and Lymphatic ROS: negative for - bleeding  problems, bruising, swollen lymph nodes or weight loss Endocrine ROS: negative for - galactorrhea, hair pattern changes, hot flashes, malaise/lethargy, mood swings, palpitations, polydipsia/polyuria, skin changes, temperature intolerance or unexpected weight changes Breast ROS: negative for - new or changing breast lumps or nipple discharge Respiratory ROS: negative for - cough or shortness of breath Cardiovascular ROS: negative for - chest pain, irregular heartbeat, palpitations or shortness of breath Gastrointestinal ROS: no abdominal pain, change in bowel habits, or black or bloody stools Genito-Urinary ROS: no dysuria, trouble voiding, or hematuria Musculoskeletal ROS: negative for - joint pain or joint stiffness Neurological ROS: negative for - bowel and bladder control changes Dermatological ROS: negative for rash and skin lesion changes   Objective:   BP 115/81   Pulse 90   Ht 5' 8.11" (1.73 m)   Wt 190 lb 14.4 oz (86.6 kg)   BMI 28.93 kg/m  CONSTITUTIONAL: Well-developed, well-nourished female in no acute distress.  PSYCHIATRIC: Normal mood and affect. Normal behavior. Normal judgment and thought content. [place]: Alert and oriented to person, place, and time. Normal muscle tone coordination. No cranial nerve deficit noted. HENT:  Normocephalic, atraumatic, External right and left ear normal. Oropharynx is clear and moist EYES: Conjunctivae and EOM are normal. Pupils are equal, round, and reactive to light. No scleral icterus.  NECK: Normal range of motion, supple, no masses.  Normal thyroid.  SKIN: Skin is warm and dry. No rash noted. Not diaphoretic. No erythema. No pallor. CARDIOVASCULAR: Normal heart rate noted, regular  rhythm, no murmur. RESPIRATORY: Clear to auscultation bilaterally. Effort and breath sounds normal, no problems with respiration noted. BREASTS: Symmetric in size. No masses, skin changes, nipple drainage, or lymphadenopathy. ABDOMEN: Soft, normal bowel sounds, no distention noted.  No tenderness, rebound or guarding.  BLADDER: Normal PELVIC:  Bladder no bladder distension noted  Urethra: normal appearing urethra with no masses, tenderness or lesions  Vulva: normal appearing vulva with no masses, tenderness or lesions  Vagina: mildly atrophic, without discharge or lesions  Cervix: normal appearing cervix without discharge or lesions  Uterus: uterus is normal size, shape, consistency and nontender  Adnexa: normal adnexa in size, nontender and no masses  RV: External Exam NormaI, No Rectal Masses and Normal Sphincter tone  MUSCULOSKELETAL: Normal range of motion. No tenderness.  No cyanosis, clubbing, or edema.  2+ distal pulses. LYMPHATIC: No Axillary, Supraclavicular, or Inguinal Adenopathy.   Labs: Lab Results  Component Value Date   WBC 5.9 08/23/2020   HGB 14.4 08/23/2020   HCT 42.3 08/23/2020   MCV 90.3 08/23/2020   PLT 183.0 08/23/2020    Lab Results  Component Value Date   CREATININE 0.78 08/23/2020   BUN 19 08/23/2020   NA 139 08/23/2020   K 4.1 08/23/2020   CL 102 08/23/2020   CO2 26 08/23/2020    Lab Results  Component Value Date   ALT 25 08/23/2020   AST 17 08/23/2020   ALKPHOS 45 08/23/2020   BILITOT 0.8 08/23/2020    Lab Results  Component Value Date   CHOL 238 (H) 08/23/2020   HDL 74.00 08/23/2020   LDLCALC 153 (H) 08/23/2020   TRIG 55.0 08/23/2020   CHOLHDL 3 08/23/2020    Lab Results  Component Value Date   TSH 2.11 08/23/2020    Lab Results  Component Value Date   HGBA1C 4.9 09/30/2018     Assessment:   1. Establishing care with new doctor, encounter for   2. Vaginal dryness, menopausal   3. Visit  for gynecologic examination   4.  Hyperlipidemia, unspecified hyperlipidemia type   5. Pap smear for cervical cancer screening   6. Overweight (BMI 25.0-29.9)     Plan:  Pap: Pap Co Test Mammogram: Ordered by PCP Stool Guaiac Testing:  Not Indicated. Colonoscopy is up to date.  Labs: None ordered.  Had labs with PCP.  Routine preventative health maintenance measures emphasized: Exercise/Diet/Weight control, Alcohol/Substance use risks and Stress Management  Encourage Vitamin D and Calcium supplementation for osteoporosis prevention.  Vaginal atrophy treated with Estring, doing well.  Hyperlipidemia managed by PCP.  COVID Vaccination status: patient has declined vaccination at this time.  May consider in the future. Discussion had on concerns with vaccine.  Flu vaccine: will get at job.    Return to Clinic - Every other year for routine GYN exam. Will alternate with PCP.    Rubie Maid, MD  [facility]

## 2020-08-30 NOTE — Progress Notes (Signed)
Pt present to est care and annual exam. Pt is having vaginal dryness with sex from menopause, pt stated since having the Estring vaginal ring the dryness has gotten better.  Pap smear completed today.

## 2020-08-30 NOTE — Patient Instructions (Signed)

## 2020-09-02 ENCOUNTER — Encounter: Payer: Self-pay | Admitting: Obstetrics and Gynecology

## 2020-09-02 DIAGNOSIS — N951 Menopausal and female climacteric states: Secondary | ICD-10-CM | POA: Insufficient documentation

## 2020-09-03 LAB — CYTOLOGY - PAP
Comment: NEGATIVE
Diagnosis: NEGATIVE
High risk HPV: NEGATIVE

## 2020-09-25 ENCOUNTER — Ambulatory Visit: Payer: Self-pay

## 2020-09-25 ENCOUNTER — Ambulatory Visit: Payer: No Typology Code available for payment source | Attending: Internal Medicine

## 2020-09-25 DIAGNOSIS — Z23 Encounter for immunization: Secondary | ICD-10-CM

## 2020-09-25 NOTE — Progress Notes (Signed)
   Covid-19 Vaccination Clinic  Name:  Sarah Dennis    MRN: [id number] DOB: [birth year]  09/25/2020  [name] was observed post Covid-19 immunization for 15 minutes without incident. She was provided with Vaccine Information Sheet and instruction to access the V-Safe system.   [name] was instructed to call 911 with any severe reactions post vaccine: Marland Kitchen Difficulty breathing  . Swelling of face and throat  . A fast heartbeat  . A bad rash all over body  . Dizziness and weakness   Immunizations Administered    Name Date Dose VIS Date Route   Pfizer COVID-19 Vaccine 09/25/2020  2:57 PM 0.3 mL 09/12/2020 Intramuscular   Manufacturer: [place]   Lot: [id number]   NDC: [address]-6

## 2020-10-22 ENCOUNTER — Ambulatory Visit: Payer: No Typology Code available for payment source | Attending: Internal Medicine

## 2020-10-22 DIAGNOSIS — Z23 Encounter for immunization: Secondary | ICD-10-CM

## 2020-10-22 NOTE — Progress Notes (Signed)
   Covid-19 Vaccination Clinic  Name:  Sarah Dennis    MRN: [id number] DOB: [birth year]  10/22/2020  [name] was observed post Covid-19 immunization for 15 minutes without incident. She was provided with Vaccine Information Sheet and instruction to access the V-Safe system.   [name] was instructed to call 911 with any severe reactions post vaccine: Marland Kitchen Difficulty breathing  . Swelling of face and throat  . A fast heartbeat  . A bad rash all over body  . Dizziness and weakness   Immunizations Administered    Name Date Dose VIS Date Route   Pfizer COVID-19 Vaccine 10/22/2020  1:58 PM 0.3 mL 09/12/2020 Intramuscular   Manufacturer: [place]   Lot: [id number]   [place]: [address]-2

## 2020-10-25 ENCOUNTER — Encounter: Payer: Self-pay | Admitting: Internal Medicine

## 2021-01-22 ENCOUNTER — Other Ambulatory Visit: Payer: Self-pay | Admitting: Optometrist

## 2021-02-12 ENCOUNTER — Other Ambulatory Visit: Payer: Self-pay | Admitting: Internal Medicine

## 2021-02-12 ENCOUNTER — Encounter: Payer: Self-pay | Admitting: Internal Medicine

## 2021-02-12 ENCOUNTER — Other Ambulatory Visit: Payer: Self-pay

## 2021-02-12 DIAGNOSIS — Z780 Asymptomatic menopausal state: Secondary | ICD-10-CM

## 2021-02-12 DIAGNOSIS — R112 Nausea with vomiting, unspecified: Secondary | ICD-10-CM

## 2021-02-23 ENCOUNTER — Other Ambulatory Visit: Payer: Self-pay | Admitting: Internal Medicine

## 2021-02-23 ENCOUNTER — Other Ambulatory Visit: Payer: Self-pay

## 2021-02-25 ENCOUNTER — Other Ambulatory Visit: Payer: Self-pay

## 2021-02-25 MED ORDER — FEMRING 0.05 MG/24HR VA RING
VAGINAL_RING | VAGINAL | 4 refills | Status: DC
Start: 2021-02-25 — End: 2021-09-11
  Filled 2021-02-25: qty 1, 84d supply, fill #0
  Filled 2021-05-22: qty 1, 84d supply, fill #1
  Filled 2021-08-12: qty 1, 84d supply, fill #2

## 2021-03-04 ENCOUNTER — Other Ambulatory Visit: Payer: Self-pay

## 2021-03-04 ENCOUNTER — Other Ambulatory Visit: Payer: Self-pay | Admitting: Internal Medicine

## 2021-03-04 DIAGNOSIS — M5416 Radiculopathy, lumbar region: Secondary | ICD-10-CM

## 2021-03-04 DIAGNOSIS — M62830 Muscle spasm of back: Secondary | ICD-10-CM

## 2021-03-04 MED ORDER — BACLOFEN 10 MG PO TABS
10.0000 mg | ORAL_TABLET | Freq: Every evening | ORAL | 3 refills | Status: DC | PRN
  Filled 2021-03-04: qty 90, 90d supply, fill #0
  Filled 2021-08-16 – 2021-08-19 (×2): qty 90, 90d supply, fill #1

## 2021-04-16 ENCOUNTER — Other Ambulatory Visit: Payer: Self-pay

## 2021-04-16 ENCOUNTER — Other Ambulatory Visit: Payer: Self-pay | Admitting: Internal Medicine

## 2021-04-16 DIAGNOSIS — K22719 Barrett's esophagus with dysplasia, unspecified: Secondary | ICD-10-CM

## 2021-04-16 MED FILL — Pantoprazole Sodium EC Tab 40 MG (Base Equiv): ORAL | 90 days supply | Qty: 90 | Fill #0 | Status: CN

## 2021-04-17 ENCOUNTER — Other Ambulatory Visit: Payer: Self-pay

## 2021-04-17 MED FILL — Pantoprazole Sodium EC Tab 40 MG (Base Equiv): ORAL | 90 days supply | Qty: 90 | Fill #0 | Status: CN

## 2021-05-01 ENCOUNTER — Other Ambulatory Visit: Payer: Self-pay

## 2021-05-01 MED FILL — Pantoprazole Sodium EC Tab 40 MG (Base Equiv): ORAL | 90 days supply | Qty: 90 | Fill #0 | Status: AC

## 2021-05-24 ENCOUNTER — Other Ambulatory Visit: Payer: Self-pay

## 2021-07-17 MED FILL — Metoclopramide HCl Tab 10 MG (Base Equivalent): ORAL | 90 days supply | Qty: 90 | Fill #0 | Status: AC

## 2021-07-18 ENCOUNTER — Other Ambulatory Visit: Payer: Self-pay

## 2021-07-28 MED FILL — Pantoprazole Sodium EC Tab 40 MG (Base Equiv): ORAL | 90 days supply | Qty: 90 | Fill #1 | Status: AC

## 2021-07-30 ENCOUNTER — Other Ambulatory Visit: Payer: Self-pay

## 2021-08-13 ENCOUNTER — Other Ambulatory Visit: Payer: Self-pay

## 2021-08-19 ENCOUNTER — Other Ambulatory Visit: Payer: Self-pay

## 2021-08-27 ENCOUNTER — Encounter: Payer: No Typology Code available for payment source | Admitting: Internal Medicine

## 2021-09-11 ENCOUNTER — Other Ambulatory Visit: Payer: Self-pay

## 2021-09-11 ENCOUNTER — Encounter: Payer: Self-pay | Admitting: Internal Medicine

## 2021-09-11 ENCOUNTER — Ambulatory Visit (INDEPENDENT_AMBULATORY_CARE_PROVIDER_SITE_OTHER): Payer: No Typology Code available for payment source | Admitting: Internal Medicine

## 2021-09-11 VITALS — BP 112/86 | HR 78 | Temp 96.4°F | Ht 67.91 in | Wt 180.0 lb

## 2021-09-11 DIAGNOSIS — Z1231 Encounter for screening mammogram for malignant neoplasm of breast: Secondary | ICD-10-CM

## 2021-09-11 DIAGNOSIS — E559 Vitamin D deficiency, unspecified: Secondary | ICD-10-CM | POA: Diagnosis not present

## 2021-09-11 DIAGNOSIS — Z0000 Encounter for general adult medical examination without abnormal findings: Secondary | ICD-10-CM | POA: Diagnosis not present

## 2021-09-11 DIAGNOSIS — Z1329 Encounter for screening for other suspected endocrine disorder: Secondary | ICD-10-CM

## 2021-09-11 DIAGNOSIS — E785 Hyperlipidemia, unspecified: Secondary | ICD-10-CM

## 2021-09-11 DIAGNOSIS — R29890 Loss of height: Secondary | ICD-10-CM

## 2021-09-11 DIAGNOSIS — M5416 Radiculopathy, lumbar region: Secondary | ICD-10-CM

## 2021-09-11 DIAGNOSIS — G8929 Other chronic pain: Secondary | ICD-10-CM

## 2021-09-11 DIAGNOSIS — M545 Low back pain, unspecified: Secondary | ICD-10-CM

## 2021-09-11 DIAGNOSIS — Z1389 Encounter for screening for other disorder: Secondary | ICD-10-CM | POA: Diagnosis not present

## 2021-09-11 DIAGNOSIS — M62830 Muscle spasm of back: Secondary | ICD-10-CM

## 2021-09-11 LAB — LIPID PANEL
Cholesterol: 226 mg/dL — ABNORMAL HIGH (ref 0–200)
HDL: 68.9 mg/dL (ref >39.00)
LDL Cholesterol: 143 mg/dL — ABNORMAL HIGH (ref 0–99)
NonHDL: 157.06
Total CHOL/HDL Ratio: 3
Triglycerides: 68 mg/dL (ref 0.0–149.0)
VLDL: 13.6 mg/dL (ref 0.0–40.0)

## 2021-09-11 LAB — COMPREHENSIVE METABOLIC PANEL
ALT: 15 U/L (ref 0–35)
AST: 15 U/L (ref 0–37)
Albumin: 4.6 g/dL (ref 3.5–5.2)
Alkaline Phosphatase: 40 U/L (ref 39–117)
BUN: 16 mg/dL (ref 6–23)
CO2: 29 mEq/L (ref 19–32)
Calcium: 9.7 mg/dL (ref 8.4–10.5)
Chloride: 105 mEq/L (ref 96–112)
Creatinine, Ser: 0.82 mg/dL (ref 0.40–1.20)
GFR: 79 mL/min (ref >60.00)
Glucose, Bld: 105 mg/dL — ABNORMAL HIGH (ref 70–99)
Potassium: 4.4 mEq/L (ref 3.5–5.1)
Sodium: 141 mEq/L (ref 135–145)
Total Bilirubin: 0.7 mg/dL (ref 0.2–1.2)
Total Protein: 7.1 g/dL (ref 6.0–8.3)

## 2021-09-11 LAB — CBC WITH DIFFERENTIAL/PLATELET
Basophils Absolute: 0 10*3/uL (ref 0.0–0.1)
Basophils Relative: 0.4 % (ref 0.0–3.0)
Eosinophils Absolute: 0.1 10*3/uL (ref 0.0–0.7)
Eosinophils Relative: 2.5 % (ref 0.0–5.0)
HCT: 43.7 % (ref 36.0–46.0)
Hemoglobin: 14.7 g/dL (ref 12.0–15.0)
Lymphocytes Relative: 31.2 % (ref 12.0–46.0)
Lymphs Abs: 1.7 10*3/uL (ref 0.7–4.0)
MCHC: 33.7 g/dL (ref 30.0–36.0)
MCV: 92 fl (ref 78.0–100.0)
Monocytes Absolute: 0.4 10*3/uL (ref 0.1–1.0)
Monocytes Relative: 8.3 % (ref 3.0–12.0)
Neutro Abs: 3.1 10*3/uL (ref 1.4–7.7)
Neutrophils Relative %: 57.6 % (ref 43.0–77.0)
Platelets: 199 10*3/uL (ref 150.0–400.0)
RBC: 4.75 Mil/uL (ref 3.87–5.11)
RDW: 14.3 % (ref 11.5–15.5)
WBC: 5.4 10*3/uL (ref 4.0–10.5)

## 2021-09-11 LAB — TSH: TSH: 2.05 u[IU]/mL (ref 0.35–5.50)

## 2021-09-11 LAB — VITAMIN D 25 HYDROXY (VIT D DEFICIENCY, FRACTURES): VITD: 23.49 ng/mL — ABNORMAL LOW (ref 30.00–100.00)

## 2021-09-11 MED ORDER — FEMRING 0.05 MG/24HR VA RING
VAGINAL_RING | VAGINAL | 4 refills | Status: DC
  Filled 2021-09-11: qty 1, fill #0
  Filled 2021-11-26: qty 1, 90d supply, fill #0
  Filled 2022-02-20: qty 1, 90d supply, fill #1
  Filled 2022-05-16: qty 1, 90d supply, fill #2

## 2021-09-11 MED ORDER — BACLOFEN 10 MG PO TABS
10.0000 mg | ORAL_TABLET | Freq: Every evening | ORAL | 3 refills | Status: DC | PRN
  Filled 2021-09-11 – 2021-12-30 (×2): qty 90, 90d supply, fill #0
  Filled 2022-05-14: qty 90, 90d supply, fill #1
  Filled 2022-08-20: qty 90, 90d supply, fill #2

## 2021-09-11 NOTE — Addendum Note (Signed)
Addended by: Leeanne Rio on: 09/11/2021 04:01 PM   Modules accepted: Orders

## 2021-09-11 NOTE — Progress Notes (Signed)
Chief Complaint  Patient presents with   Annual Exam   Annual  1. 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 Ht was 5'8" but down last dexa 2015 normal  3. Colonoscopy utd, pap utd, mammogram due, flu utd will get covid shot with work     Review of Systems  Constitutional:  Negative for weight loss.  HENT:  Negative for hearing loss.   Eyes:  Negative for blurred vision.  Respiratory:  Negative for shortness of breath.   Cardiovascular:  Negative for chest pain.  Gastrointestinal:  Negative for abdominal pain.  Musculoskeletal:  Positive for back pain.  Skin:  Negative for rash.  Neurological:  Negative for headaches.  Psychiatric/Behavioral:  Negative for depression. The patient is not nervous/anxious.   Past Medical History:  Diagnosis Date   Arthritis    Barrett's esophagus    Carpal tunnel syndrome 01/08/2016   Right   COVID-19    beg. 2022   Esophagitis    Esophagitis    eosinophillic    GERD (gastroesophageal reflux disease)    Hyperlipidemia    Shingles    left eye x 2    Past Surgical History:  Procedure Laterality Date   CESAREAN SECTION  1988   COLONOSCOPY  07/2013   Dr. Vira Agar   mole removal  82 yaers ago   [place]     2007   [place]  2007,2012   Family History  Problem Relation Age of Onset   Cancer Mother        pancreatitic cancer    Hypothyroidism Mother    Goiter Mother    Depression Father    Dementia Father    Bipolar disorder Father    Hypothyroidism Father    Goiter Father    Depression Sister    Hyperlipidemia Sister    Hyperlipidemia Brother    Hypothyroidism Brother    Heart disease Paternal Grandfather        MI   Breast cancer Neg Hx    Social History   Socioeconomic History   Marital  status: Married    Spouse name: Not on file   Number of children: 2   Years of education: Not on file   Highest education level: Not on file  Occupational History   Not on file  Tobacco Use   Smoking status: Never   Smokeless tobacco: Never  Vaping Use   Vaping Use: Never used  Substance and Sexual Activity   Alcohol use: Yes    Comment: occasionally   Drug use: No   Sexual activity: Yes    Comment: men  Other Topics Concern   Not on file  Social History Narrative   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   FEMRING 0.05 MG/24HR RING PLACE VAGINALLY EVERY 3 MONTHS. FOLLOW PACKAGE DIRECTIONS   metoCLOPramide (REGLAN) 10 MG tablet TAKE 1 TABLET BY MOUTH DAILY AS NEEDED FOR NAUSEA   pantoprazole (PROTONIX) 40 MG tablet TAKE 1 TABLET BY MOUTH DAILY 30 MINUTES BEFORE FOOD   [DISCONTINUED] baclofen (LIORESAL) 10 MG tablet Take 1 tablet (10 mg total) by mouth at bedtime as needed.   [DISCONTINUED] estradiol (ESTRING) 2 MG vaginal ring Place 0.05 mg vaginally every 3 (three) months. follow package directions   [DISCONTINUED] Estradiol Acetate (FEMRING) 0.05 MG/24HR RING PLACE 1 RING VAGINALLY EVERY 3 MONTHS. FOLLOW PACKAGE DIRECTIONS.   Allergies  Allergen Reactions   Corn Starch Nausea And Vomiting   Gluten Meal Nausea And Vomiting   Percocet [Oxycodone-Acetaminophen] Itching   No results found for this or any previous visit (from the past 2160 hour(s)). Objective  Body mass index is 27.44 kg/m. Wt Readings from Last 3 Encounters:  09/11/21  180 lb (81.6 kg)  08/30/20 190 lb 14.4 oz (86.6 kg)  08/23/20 194 lb 12.8 oz (88.4 kg)   Temp Readings from Last 3 Encounters:  09/11/21 (!) 96.4 F (35.8 C)  08/23/20 98.2 F (36.8 C) (Oral)  08/10/19 (!) 97.4 F (36.3 C) (Temporal)   BP Readings from Last 3 Encounters:  09/11/21 112/86  08/30/20 115/81  08/23/20 130/86   Pulse Readings from Last 3 Encounters:  09/11/21 78  08/30/20 90  08/23/20 73    Physical Exam Vitals and nursing note reviewed.  Constitutional:      Appearance: Normal appearance. She is well-developed and well-groomed.  HENT:     Head: Normocephalic and atraumatic.  Eyes:     Conjunctiva/sclera: Conjunctivae normal.     Pupils: Pupils are equal, round, and reactive to light.  Cardiovascular:     Rate and Rhythm: Normal rate and regular rhythm.     Heart sounds: Normal heart sounds. No murmur heard. Pulmonary:     Effort: Pulmonary effort is normal.     Breath sounds: Normal breath sounds.  Abdominal:     Tenderness: There is no abdominal tenderness.  Skin:    General: Skin is warm and dry.  Neurological:     General: No focal deficit present.     Mental Status: She is alert and oriented to person, place, and time. Mental status is at baseline.     Gait: Gait normal.  Psychiatric:        Attention and Perception: Attention and perception normal.        Mood and Affect: Mood and affect normal.        Speech: Speech normal.        Behavior: Behavior normal. Behavior is cooperative.        Thought Content: Thought content normal.        Cognition and Memory: Cognition and memory normal.        Judgment: Judgment normal.    Assessment  Plan  Annual physical exam -  Plan: Comprehensive metabolic panel, CBC w/Diff, Lipid panel, TSH, Urinalysis, Routine w reflex microscopic, Vitamin D (25 hydroxy) Flu had 09/02/20 at work covid 87 will get at work per chart had 2/2 Tdap utd 08/23/20   Had shingrix 2/2 08/18/18  Protected MMR titer checked  03/31/08  immune, hep B immune titer 1028 3/19/1 no hep C or HIV  quantiferon gold neg 03/31/08 Varicella immune  03/31/08    mammo neg 11/15/19 call to schedule as of 09/11/21 rec call and sch mammo   07/23/17 pap from Dr. Cherylann Banas ob/gyn normal  -- Dr. Marcelline Mates had 08/30/20 negative    DEXA 01/12/14 normal with ht loss consider repeat DEXA with mammogram    Colonoscopy [place] Dr. Tiffany Kocher 08/23/13 ext/int hemorrhoids repeat in 10 years  EGD + barretts since 2014 last EGD 09/29/17 see above due 09/2020 referred [facility]    rec healthy diet choices and exercise   Does not f/u dermatology Dr. Bary Castilla bx skin lesion or ln2  No derm needed for now 08/23/20   Hyperlipidemia, unspecified hyperlipidemia type - Plan: Lipid panel Consider Dr. Debara Pickett in the future  Lumbar radiculopathy - Plan: baclofen (LIORESAL) 10 MG tablet Muscle spasm of back - Plan: baclofen (LIORESAL) 10 MG tablet  Consider MRI in future low back and ortho spine Pt in the past did not help  Provider: Dr. Olivia Mackie McLean-Scocuzza-[facility] Medicine

## 2021-09-12 LAB — URINALYSIS, ROUTINE W REFLEX MICROSCOPIC
Bilirubin Urine: NEGATIVE
Glucose, UA: NEGATIVE
Hgb urine dipstick: NEGATIVE
Ketones, ur: NEGATIVE
Leukocytes,Ua: NEGATIVE
Nitrite: NEGATIVE
Protein, ur: NEGATIVE
Specific Gravity, Urine: 1.022 (ref 1.001–1.035)
pH: 5.5 (ref 5.0–8.0)

## 2021-09-12 NOTE — Telephone Encounter (Signed)
For your information  

## 2021-09-17 ENCOUNTER — Telehealth: Payer: Self-pay | Admitting: Internal Medicine

## 2021-09-17 NOTE — Telephone Encounter (Signed)
Patient returned office phone call for lab results. 

## 2021-09-17 NOTE — Progress Notes (Signed)
Left message to return call 

## 2021-09-19 NOTE — Telephone Encounter (Signed)
Patient informed and verbalized understanding

## 2021-10-27 MED FILL — Pantoprazole Sodium EC Tab 40 MG (Base Equiv): ORAL | 90 days supply | Qty: 90 | Fill #2 | Status: AC

## 2021-10-28 ENCOUNTER — Other Ambulatory Visit: Payer: Self-pay

## 2021-11-27 ENCOUNTER — Other Ambulatory Visit: Payer: Self-pay

## 2021-11-28 ENCOUNTER — Other Ambulatory Visit: Payer: Self-pay

## 2021-12-30 ENCOUNTER — Other Ambulatory Visit: Payer: Self-pay

## 2022-01-13 ENCOUNTER — Encounter: Payer: Self-pay | Admitting: Internal Medicine

## 2022-01-13 NOTE — Telephone Encounter (Signed)
Patient last seen 09/11/21. Okay for referral?

## 2022-01-14 DIAGNOSIS — G8929 Other chronic pain: Secondary | ICD-10-CM | POA: Insufficient documentation

## 2022-01-14 DIAGNOSIS — M545 Low back pain, unspecified: Secondary | ICD-10-CM | POA: Insufficient documentation

## 2022-01-14 NOTE — Addendum Note (Signed)
Addended by: Orland Mustard on: 01/14/2022 09:23 AM   Modules accepted: Orders

## 2022-01-23 ENCOUNTER — Encounter: Payer: Self-pay | Admitting: Internal Medicine

## 2022-01-24 ENCOUNTER — Other Ambulatory Visit: Payer: Self-pay

## 2022-01-24 MED FILL — Pantoprazole Sodium EC Tab 40 MG (Base Equiv): ORAL | 90 days supply | Qty: 90 | Fill #3 | Status: AC

## 2022-02-21 ENCOUNTER — Other Ambulatory Visit: Payer: Self-pay

## 2022-03-18 ENCOUNTER — Encounter: Payer: Self-pay | Admitting: Internal Medicine

## 2022-03-18 ENCOUNTER — Other Ambulatory Visit: Payer: Self-pay

## 2022-03-18 ENCOUNTER — Other Ambulatory Visit: Payer: Self-pay | Admitting: Internal Medicine

## 2022-03-18 DIAGNOSIS — K22719 Barrett's esophagus with dysplasia, unspecified: Secondary | ICD-10-CM

## 2022-03-18 MED ORDER — PANTOPRAZOLE SODIUM 20 MG PO TBEC
20.0000 mg | DELAYED_RELEASE_TABLET | Freq: Every day | ORAL | 3 refills | Status: DC
  Filled 2022-03-18: qty 90, 90d supply, fill #0
  Filled 2022-06-13: qty 90, 90d supply, fill #1
  Filled 2022-09-09: qty 90, 90d supply, fill #2
  Filled 2022-12-11: qty 90, 90d supply, fill #3

## 2022-03-19 ENCOUNTER — Other Ambulatory Visit: Payer: Self-pay

## 2022-05-15 ENCOUNTER — Other Ambulatory Visit: Payer: Self-pay

## 2022-05-19 ENCOUNTER — Other Ambulatory Visit: Payer: Self-pay

## 2022-06-13 ENCOUNTER — Other Ambulatory Visit: Payer: Self-pay

## 2022-07-16 ENCOUNTER — Ambulatory Visit (INDEPENDENT_AMBULATORY_CARE_PROVIDER_SITE_OTHER): Payer: No Typology Code available for payment source | Admitting: Family Medicine

## 2022-07-16 ENCOUNTER — Encounter: Payer: Self-pay | Admitting: Family Medicine

## 2022-07-16 VITALS — BP 108/66 | HR 67 | Temp 98.9°F | Ht 67.91 in | Wt 177.8 lb

## 2022-07-16 DIAGNOSIS — K2270 Barrett's esophagus without dysplasia: Secondary | ICD-10-CM | POA: Diagnosis not present

## 2022-07-16 DIAGNOSIS — Z0000 Encounter for general adult medical examination without abnormal findings: Secondary | ICD-10-CM

## 2022-07-16 DIAGNOSIS — E663 Overweight: Secondary | ICD-10-CM

## 2022-07-16 DIAGNOSIS — E785 Hyperlipidemia, unspecified: Secondary | ICD-10-CM | POA: Diagnosis not present

## 2022-07-16 NOTE — Patient Instructions (Addendum)
It was a pleasure meeting you today. Thank you for allowing me to take part in your health care.  Our goals for today as we discussed include:  Refill Reglan  Continue Protonix 20 mg daily  Your PAP smear is due in Oct 2026    Please follow-up with PCP in 2 months for yearly blood work.  Please schedule appointment for lab work a few days prior to visit. Please fast 12 hrs before blood work  If you have any questions or concerns, please do not hesitate to call the office at [phone].  I look forward to our next visit and until then take care and stay safe.  Regards,   Carollee Leitz, MD   [facility]

## 2022-07-16 NOTE — Progress Notes (Signed)
    SUBJECTIVE:   CHIEF COMPLAINT / HPI: transfer of care  Patient presents to clinic to transfer care to new PCP  No acute concerns today  Needs refill for Reglan   PERTINENT  PMH / PSH:  Barrett's esophagus without dysplasia  OBJECTIVE:   BP 108/66 (BP Location: Left Arm, Patient Position: Sitting, Cuff Size: Normal)   Pulse 67   Temp 98.9 F (37.2 C) (Oral)   Ht 5' 7.91" (1.725 m)   Wt 177 lb 12.8 oz (80.6 kg)   SpO2 96%   BMI 27.11 kg/m    General: Alert, no acute distress Cardio: Normal S1 and S2, RRR, no r/m/g Pulm: CTAB, normal work of breathing Abdomen: Bowel sounds normal. Abdomen soft and non-tender.  Extremities: No peripheral edema.    ASSESSMENT/PLAN:   Barrett's esophagus without dysplasia Chronic.  Asymptomatic -Continue Protonix 20 mg daily -Refill Reglan 10 mg daily as needed -Follow up with PCP as needed -Follow up with GI as      Carollee Leitz, MD

## 2022-07-25 ENCOUNTER — Other Ambulatory Visit: Payer: Self-pay

## 2022-07-25 ENCOUNTER — Encounter: Payer: Self-pay | Admitting: Family Medicine

## 2022-07-25 MED ORDER — METOCLOPRAMIDE HCL 10 MG PO TABS
ORAL_TABLET | ORAL | 1 refills | Status: DC
Start: 2022-07-25 — End: 2023-09-22
  Filled 2022-07-25: qty 90, 90d supply, fill #0

## 2022-07-25 NOTE — Assessment & Plan Note (Signed)
Chronic.  Asymptomatic -Continue Protonix 20 mg daily -Refill Reglan 10 mg daily as needed -Follow up with PCP as needed -Follow up with GI as

## 2022-08-06 ENCOUNTER — Ambulatory Visit
Admission: RE | Admit: 2022-08-06 | Discharge: 2022-08-06 | Disposition: A | Discharge status: Home / Self Care | Payer: No Typology Code available for payment source | Source: Ambulatory Visit | Attending: Internal Medicine | Admitting: Internal Medicine

## 2022-08-06 DIAGNOSIS — Z1231 Encounter for screening mammogram for malignant neoplasm of breast: Secondary | ICD-10-CM | POA: Diagnosis present

## 2022-08-20 ENCOUNTER — Other Ambulatory Visit: Payer: Self-pay

## 2022-09-09 ENCOUNTER — Other Ambulatory Visit: Payer: Self-pay

## 2022-09-11 ENCOUNTER — Encounter: Payer: No Typology Code available for payment source | Admitting: Internal Medicine

## 2022-09-15 ENCOUNTER — Other Ambulatory Visit (INDEPENDENT_AMBULATORY_CARE_PROVIDER_SITE_OTHER): Payer: No Typology Code available for payment source

## 2022-09-15 DIAGNOSIS — E785 Hyperlipidemia, unspecified: Secondary | ICD-10-CM | POA: Diagnosis not present

## 2022-09-15 DIAGNOSIS — E663 Overweight: Secondary | ICD-10-CM

## 2022-09-15 LAB — COMPREHENSIVE METABOLIC PANEL
ALT: 12 U/L (ref 0–35)
AST: 14 U/L (ref 0–37)
Albumin: 4.5 g/dL (ref 3.5–5.2)
Alkaline Phosphatase: 23 U/L — ABNORMAL LOW (ref 39–117)
BUN: 14 mg/dL (ref 6–23)
CO2: 29 mEq/L (ref 19–32)
Calcium: 9.3 mg/dL (ref 8.4–10.5)
Chloride: 103 mEq/L (ref 96–112)
Creatinine, Ser: 0.67 mg/dL (ref 0.40–1.20)
GFR: 95.85 mL/min (ref >60.00)
Glucose, Bld: 98 mg/dL (ref 70–99)
Potassium: 4.2 mEq/L (ref 3.5–5.1)
Sodium: 142 mEq/L (ref 135–145)
Total Bilirubin: 0.7 mg/dL (ref 0.2–1.2)
Total Protein: 6.9 g/dL (ref 6.0–8.3)

## 2022-09-15 LAB — LIPID PANEL
Cholesterol: 328 mg/dL — ABNORMAL HIGH (ref 0–200)
HDL: 48.8 mg/dL (ref >39.00)
LDL Cholesterol: 259 mg/dL — ABNORMAL HIGH (ref 0–99)
NonHDL: 279.21
Total CHOL/HDL Ratio: 7
Triglycerides: 100 mg/dL (ref 0.0–149.0)
VLDL: 20 mg/dL (ref 0.0–40.0)

## 2022-09-15 LAB — CBC
HCT: 42.5 % (ref 36.0–46.0)
Hemoglobin: 14.3 g/dL (ref 12.0–15.0)
MCHC: 33.7 g/dL (ref 30.0–36.0)
MCV: 91.3 fl (ref 78.0–100.0)
Platelets: 159 10*3/uL (ref 150.0–400.0)
RBC: 4.66 Mil/uL (ref 3.87–5.11)
RDW: 13.7 % (ref 11.5–15.5)
WBC: 4.5 10*3/uL (ref 4.0–10.5)

## 2022-09-15 LAB — VITAMIN D 25 HYDROXY (VIT D DEFICIENCY, FRACTURES): VITD: 30.22 ng/mL (ref 30.00–100.00)

## 2022-09-15 LAB — VITAMIN B12: Vitamin B-12: 731 pg/mL (ref 211–911)

## 2022-09-18 ENCOUNTER — Ambulatory Visit (INDEPENDENT_AMBULATORY_CARE_PROVIDER_SITE_OTHER): Payer: No Typology Code available for payment source | Admitting: Family Medicine

## 2022-09-18 ENCOUNTER — Telehealth: Payer: Self-pay | Admitting: Family Medicine

## 2022-09-18 ENCOUNTER — Other Ambulatory Visit: Payer: Self-pay

## 2022-09-18 ENCOUNTER — Encounter: Payer: Self-pay | Admitting: Family Medicine

## 2022-09-18 VITALS — BP 124/82 | HR 80 | Temp 98.0°F | Ht 67.91 in | Wt 174.6 lb

## 2022-09-18 DIAGNOSIS — Z136 Encounter for screening for cardiovascular disorders: Secondary | ICD-10-CM

## 2022-09-18 DIAGNOSIS — N951 Menopausal and female climacteric states: Secondary | ICD-10-CM | POA: Diagnosis not present

## 2022-09-18 DIAGNOSIS — E7849 Other hyperlipidemia: Secondary | ICD-10-CM

## 2022-09-18 DIAGNOSIS — Z0000 Encounter for general adult medical examination without abnormal findings: Secondary | ICD-10-CM

## 2022-09-18 MED ORDER — FEMRING 0.05 MG/24HR VA RING
VAGINAL_RING | VAGINAL | 4 refills | Status: DC
  Filled 2022-09-18: qty 1, 90d supply, fill #0
  Filled 2022-12-16: qty 1, 90d supply, fill #1

## 2022-09-18 NOTE — Telephone Encounter (Signed)
Lft pt vm to call [phone] press option 3 and then 2 to sch. thanks

## 2022-09-18 NOTE — Patient Instructions (Addendum)
It was a pleasure meeting you today. Thank you for allowing me to take part in your health care.  Our goals for today as we discussed include:  For your Cholesterol Your bad cholesterol is high.   Cardiac Calcium CT has been ordered. This is a 99 dollar self pay imaging. They will call you with an appointment. Please call the office if you do not have anything scheduled in the next 2 weeks Recommend starting Crestor 40 mg daily pending results of imaging.  Limit trans fats and increase    Refill of Femring has been sent  Follow up with GI  If needing referral please MyChart MD  Please follow-up with PCP in 12 months for next annual  If you have any questions or concerns, please do not hesitate to call the office at [phone].  I look forward to our next visit and until then take care and stay safe.  Regards,   Carollee Leitz, MD   [facility]

## 2022-09-18 NOTE — Progress Notes (Signed)
    SUBJECTIVE:   CHIEF COMPLAINT / HPI: Annual physical  Patient presents to clinic for annual wellness physical  Annual labs previously obtained prior to visit and reviewed with patient.  Elevated LDL Not currently on statin therapy.  Not interested in starting therapy at this time.  Would like to continue with diet and exercise.  Aware that the LDL have been elevated for the past few years.  Vaginal atrophy Requesting refill for vaginal Femring.  Reports has improved vaginal dryness and symptoms.  Would like to continue use.  No previous history of VTE or stroke.   PERTINENT  PMH / PSH:  Barrett's esophagus without dysplasia Vaginal atrophy/dryness   OBJECTIVE:   BP 124/82 (BP Location: Left Arm, Patient Position: Sitting, Cuff Size: Normal)   Pulse 80   Temp 98 F (36.7 C) (Oral)   Ht 5' 7.91" (1.725 m)   Wt 174 lb 9.6 oz (79.2 kg)   SpO2 98%   BMI 26.62 kg/m    General: Alert, no acute distress Cardio: Normal S1 and S2, RRR, no r/m/g Pulm: CTAB, normal work of breathing Abdomen: Bowel sounds normal. Abdomen soft and non-tender.  Extremities: No peripheral edema.  Neuro: Cranial nerves grossly intact     09/18/2022   11:07 AM 07/16/2022    8:55 AM 09/11/2021    9:30 AM 08/23/2020   11:43 AM 08/10/2019    1:17 PM  Depression screen PHQ 2/9  Decreased Interest 0 0 0 0 0  Down, Depressed, Hopeless 0 0 0 0 0  PHQ - 2 Score 0 0 0 0 0     ASSESSMENT/PLAN:   Annual physical exam Healthy 61 year old female.  Mammogram up-to-date, due 2025 Colonoscopy up-to-date, due 2024 Last 2021 Pap NILM, HPV negative, due 2024, could increase to every 5 years. DEXA scan at age 27 Tetanus up-to-date, due 2021 Annual flu vaccine up-to-date, 08/2022 Shingles vaccination completed Pneumonia 20 vaccine at age 31 RSV vaccine at age 7 Hepatitis C/HIV screening completed Recent annual labs significant for elevated LDL and cholesterol. Depression screening  negative Hypertension screening completed Follow-up in 1 year for next annual    Hyperlipidemia Review of chart reveals progressive LDL elevation in the past 2 years.  Currently on statin therapy at patient's wishes.  Given that LDL greater than 190 ASCVD recommendations to start high intensity statin therapy.  Discussed in great detail with patient and she would prefer to hold off on medications at this time.  Also discussed self-pay cardiac CT calcium scoring to determine risk of heart disease.  She would like to proceed with imaging. -Cardiac CT for calcium scoring -Follow-up with results  Vaginal dryness, menopausal Tolerating vaginal Femring.  No previous history of VTE, heart disease, stroke or migraines with aura. -Refill estradiol acetate 0.05 mg / 24-hour ring, 1 ring vaginally every 3 months -Continue to monitor and wean as appropriate    PDMP reviewed  Carollee Leitz, MD

## 2022-09-19 ENCOUNTER — Other Ambulatory Visit: Payer: Self-pay

## 2022-09-30 ENCOUNTER — Encounter: Payer: Self-pay | Admitting: Family Medicine

## 2022-09-30 NOTE — Assessment & Plan Note (Addendum)
Healthy 61 year old female.  Mammogram up-to-date, due 2025 Colonoscopy up-to-date, due 2024 Last 2021 Pap NILM, HPV negative, due 2024, could increase to every 5 years. DEXA scan at age 46 Tetanus up-to-date, due 2021 Annual flu vaccine up-to-date, 08/2022 Shingles vaccination completed Pneumonia 20 vaccine at age 41 RSV vaccine at age 27 Hepatitis C/HIV screening completed Recent annual labs significant for elevated LDL and cholesterol. Depression screening negative Hypertension screening completed Follow-up in 1 year for next annual

## 2022-09-30 NOTE — Assessment & Plan Note (Signed)
Tolerating vaginal Femring.  No previous history of VTE, heart disease, stroke or migraines with aura. -Refill estradiol acetate 0.05 mg / 24-hour ring, 1 ring vaginally every 3 months -Continue to monitor and wean as appropriate

## 2022-09-30 NOTE — Assessment & Plan Note (Signed)
Review of chart reveals progressive LDL elevation in the past 2 years.  Currently on statin therapy at patient's wishes.  Given that LDL greater than 190 ASCVD recommendations to start high intensity statin therapy.  Discussed in great detail with patient and she would prefer to hold off on medications at this time.  Also discussed self-pay cardiac CT calcium scoring to determine risk of heart disease.  She would like to proceed with imaging. -Cardiac CT for calcium scoring -Follow-up with results

## 2022-10-01 ENCOUNTER — Ambulatory Visit
Admission: RE | Admit: 2022-10-01 | Discharge: 2022-10-01 | Disposition: A | Discharge status: Home / Self Care | Payer: No Typology Code available for payment source | Source: Ambulatory Visit | Attending: Family Medicine | Admitting: Family Medicine

## 2022-10-01 ENCOUNTER — Encounter: Payer: Self-pay | Admitting: Family Medicine

## 2022-10-01 DIAGNOSIS — Z136 Encounter for screening for cardiovascular disorders: Secondary | ICD-10-CM | POA: Insufficient documentation

## 2022-10-04 ENCOUNTER — Encounter: Payer: Self-pay | Admitting: Family Medicine

## 2022-10-04 DIAGNOSIS — I716 Thoracoabdominal aortic aneurysm, without rupture, unspecified: Secondary | ICD-10-CM

## 2022-10-05 ENCOUNTER — Encounter: Payer: Self-pay | Admitting: Family Medicine

## 2022-10-07 NOTE — Telephone Encounter (Signed)
Placing referral to vascular for incidental finding of ascending thoracic abdominal aneurysm.  Patient was previously seen by Dr. Ella Jubilee

## 2022-10-10 ENCOUNTER — Encounter: Payer: Self-pay | Admitting: Family Medicine

## 2022-10-13 NOTE — Telephone Encounter (Signed)
Send referral to Dr Delana Meyer

## 2022-11-09 DIAGNOSIS — I712 Thoracic aortic aneurysm, without rupture, unspecified: Secondary | ICD-10-CM | POA: Insufficient documentation

## 2022-11-09 NOTE — Progress Notes (Unsigned)
MRN : [id number]  Sarah Dennis is a 61 y.o. (1961-08-12) female who presents with chief complaint of check circulation.  History of Present Illness:   The patient presents to the office for evaluation of an ascending thoracic aortic aneurysm. The aneurysm was found incidentally by CT scan for coronary assessment dated 10/01/2022.   Study is reviewed by me and shows 4.8 cm ascending aneurysm that is incompletely evaluated.  Patient denies chest pain or unusual back pain, no other chest or abdominal complaints.  No history of an abrupt onset of a painful toe associated with blue discoloration.     No family history of TAA/AAA.   Patient denies amaurosis fugax or TIA symptoms.  There is no history of claudication or rest pain symptoms of the lower extremities.   The patient denies angina or shortness of breath.   No outpatient medications have been marked as taking for the 11/10/22 encounter (Appointment) with Delana Meyer, Dolores Lory, MD.    Past Medical History:  Diagnosis Date   Arthritis    Barrett's esophagus    Carpal tunnel syndrome 01/08/2016   Right   COVID-19    beg. 2022   Esophagitis    Esophagitis    eosinophillic    GERD (gastroesophageal reflux disease)    Hyperlipidemia    Lesion of neck 02/16/2015   Mass of thigh 02/16/2015   Menopause 06/01/2018   Shingles    left eye x 2     Past Surgical History:  Procedure Laterality Date   [place]   COLONOSCOPY  07/2013   Dr. Vira Agar   mole removal  15 yaers ago   [place]     2007   [place]  2007,2012    Social History Social History   Tobacco Use   Smoking status: Never   Smokeless tobacco: Never  Vaping Use   Vaping Use: Never used  Substance Use Topics   Alcohol use: Yes    Comment: occasionally   Drug use: No    Family History Family History  Problem Relation Age of Onset   Cancer Mother        pancreatitic cancer     Hypothyroidism Mother    Goiter Mother    Depression Father    Dementia Father    Bipolar disorder Father    Hypothyroidism Father    Goiter Father    Depression Sister    Hyperlipidemia Sister    Hyperlipidemia Brother    Hypothyroidism Brother    Heart disease Paternal Grandfather        MI   Breast cancer Neg Hx     Allergies  Allergen Reactions   Corn Starch Nausea And Vomiting   Gluten Meal Nausea And Vomiting   Percocet [Oxycodone-Acetaminophen] Itching     REVIEW OF SYSTEMS (Negative unless checked)  Constitutional: '[]'$ Weight loss  '[]'$ Fever  '[]'$ Chills Cardiac: '[]'$ Chest pain   '[]'$ Chest pressure   '[]'$ Palpitations   '[]'$ Shortness of breath when laying flat   '[]'$ Shortness of breath with exertion. Vascular:  '[x]'$ Pain in legs with walking   '[]'$ Pain in legs at rest  '[]'$ History of DVT   '[]'$ Phlebitis   '[]'$ Swelling in legs   '[]'$ Varicose veins   '[]'$ Non-healing ulcers Pulmonary:   '[]'$ Uses home oxygen   '[]'$ Productive cough   '[]'$ Hemoptysis   '[]'$ Wheeze  '[]'$ COPD   '[]'$ Asthma Neurologic:  '[]'$ Dizziness   '[]'$   Seizures   '[]'$ History of stroke   '[]'$ History of TIA  '[]'$ Aphasia   '[]'$ Vissual changes   '[]'$ Weakness or numbness in arm   '[]'$ Weakness or numbness in leg Musculoskeletal:   '[]'$ Joint swelling   '[]'$ Joint pain   '[]'$ Low back pain Hematologic:  '[]'$ Easy bruising  '[]'$ Easy bleeding   '[]'$ Hypercoagulable state   '[]'$ Anemic Gastrointestinal:  '[]'$ Diarrhea   '[]'$ Vomiting  '[]'$ Gastroesophageal reflux/heartburn   '[]'$ Difficulty swallowing. Genitourinary:  '[]'$ Chronic kidney disease   '[]'$ Difficult urination  '[]'$ Frequent urination   '[]'$ Blood in urine Skin:  '[]'$ Rashes   '[]'$ Ulcers  Psychological:  '[]'$ History of anxiety   '[]'$  History of major depression.  Physical Examination  There were no vitals filed for this visit. There is no height or weight on file to calculate BMI. Gen: WD/WN, NAD Head: [place]/AT, No temporalis wasting.  Ear/Nose/Throat: Hearing grossly intact, nares w/o erythema or drainage Eyes: PER, EOMI, sclera nonicteric.  Neck: Supple, no  masses.  No bruit or JVD.  Pulmonary:  Good air movement, no audible wheezing, no use of accessory muscles.  Cardiac: RRR, normal S1, S2, no Murmurs. Vascular:  mild trophic changes, no open wounds Vessel Right Left  Radial Palpable Palpable  PT Not Palpable Not Palpable  DP Not Palpable Not Palpable  Gastrointestinal: soft, non-distended. No guarding/no peritoneal signs.  Musculoskeletal: M/S 5/5 throughout.  No visible deformity.  Neurologic: CN 2-12 intact. Pain and light touch intact in extremities.  Symmetrical.  Speech is fluent. Motor exam as listed above. Psychiatric: Judgment intact, Mood & affect appropriate for pt's clinical situation. Dermatologic: No rashes or ulcers noted.  No changes consistent with cellulitis.   CBC Lab Results  Component Value Date   WBC 4.5 09/15/2022   HGB 14.3 09/15/2022   HCT 42.5 09/15/2022   MCV 91.3 09/15/2022   PLT 159.0 09/15/2022    BMET    Component Value Date/Time   NA 142 09/15/2022 0831   NA 138 01/05/2014 0829   K 4.2 09/15/2022 0831   K 3.9 01/05/2014 0829   CL 103 09/15/2022 0831   CL 107 01/05/2014 0829   CO2 29 09/15/2022 0831   CO2 25 01/05/2014 0829   GLUCOSE 98 09/15/2022 0831   GLUCOSE 98 01/05/2014 0829   BUN 14 09/15/2022 0831   BUN 12 01/05/2014 0829   CREATININE 0.67 09/15/2022 0831   CREATININE 0.86 01/05/2014 0829   CALCIUM 9.3 09/15/2022 0831   CALCIUM 8.9 01/05/2014 0829   GFRNONAA >60 08/22/2017 0717   GFRNONAA >60 01/05/2014 0829   GFRAA >60 08/22/2017 0717   GFRAA >60 01/05/2014 0829   CrCl cannot be calculated (Patient's most recent lab result is older than the maximum 21 days allowed.).  COAG No results found for: "INR", "PROTIME"  Radiology No results found.   Assessment/Plan There are no diagnoses linked to this encounter.   Hortencia Pilar, MD  11/09/2022 2:46 PM

## 2022-11-10 ENCOUNTER — Encounter (INDEPENDENT_AMBULATORY_CARE_PROVIDER_SITE_OTHER): Payer: Self-pay | Admitting: Vascular Surgery

## 2022-11-10 ENCOUNTER — Ambulatory Visit (INDEPENDENT_AMBULATORY_CARE_PROVIDER_SITE_OTHER): Payer: No Typology Code available for payment source | Admitting: Vascular Surgery

## 2022-11-10 VITALS — BP 132/88 | HR 93 | Resp 18 | Ht 67.0 in | Wt 178.0 lb

## 2022-11-10 DIAGNOSIS — I7121 Aneurysm of the ascending aorta, without rupture: Secondary | ICD-10-CM | POA: Diagnosis not present

## 2022-11-10 DIAGNOSIS — K200 Eosinophilic esophagitis: Secondary | ICD-10-CM | POA: Insufficient documentation

## 2022-11-10 DIAGNOSIS — E7849 Other hyperlipidemia: Secondary | ICD-10-CM

## 2022-11-10 DIAGNOSIS — K2270 Barrett's esophagus without dysplasia: Secondary | ICD-10-CM | POA: Insufficient documentation

## 2022-11-11 ENCOUNTER — Encounter (INDEPENDENT_AMBULATORY_CARE_PROVIDER_SITE_OTHER): Payer: Self-pay | Admitting: Vascular Surgery

## 2022-11-25 ENCOUNTER — Ambulatory Visit: Payer: 59

## 2022-11-26 ENCOUNTER — Ambulatory Visit
Admission: RE | Admit: 2022-11-26 | Discharge: 2022-11-26 | Disposition: A | Discharge status: Home / Self Care | Payer: 59 | Source: Ambulatory Visit | Attending: Vascular Surgery | Admitting: Vascular Surgery

## 2022-11-26 DIAGNOSIS — R918 Other nonspecific abnormal finding of lung field: Secondary | ICD-10-CM | POA: Diagnosis not present

## 2022-11-26 DIAGNOSIS — I7121 Aneurysm of the ascending aorta, without rupture: Secondary | ICD-10-CM | POA: Diagnosis not present

## 2022-11-27 ENCOUNTER — Ambulatory Visit (INDEPENDENT_AMBULATORY_CARE_PROVIDER_SITE_OTHER): Payer: 59

## 2022-11-27 ENCOUNTER — Ambulatory Visit (INDEPENDENT_AMBULATORY_CARE_PROVIDER_SITE_OTHER): Payer: 59 | Admitting: Vascular Surgery

## 2022-11-27 ENCOUNTER — Encounter (INDEPENDENT_AMBULATORY_CARE_PROVIDER_SITE_OTHER): Payer: Self-pay | Admitting: Vascular Surgery

## 2022-11-27 VITALS — BP 126/80 | HR 71 | Ht 67.0 in | Wt 175.0 lb

## 2022-11-27 DIAGNOSIS — I7121 Aneurysm of the ascending aorta, without rupture: Secondary | ICD-10-CM

## 2022-11-27 DIAGNOSIS — M5416 Radiculopathy, lumbar region: Secondary | ICD-10-CM | POA: Diagnosis not present

## 2022-11-27 DIAGNOSIS — K219 Gastro-esophageal reflux disease without esophagitis: Secondary | ICD-10-CM

## 2022-11-27 DIAGNOSIS — E7849 Other hyperlipidemia: Secondary | ICD-10-CM | POA: Diagnosis not present

## 2022-11-27 NOTE — Progress Notes (Signed)
MRN : [id number]  Sarah Dennis is a 62 y.o. (11/24/1961) female who presents with chief complaint of check circulation.  History of Present Illness:  The patient presents to the office for evaluation of an ascending thoracic aortic aneurysm. The aneurysm was found incidentally by CT scan. Patient denies chest pain or unusual back pain, no other chest or abdominal complaints.  No history of an abrupt onset of a painful toe associated with blue discoloration.     No family history of TAA/AAA.   Patient denies amaurosis fugax or TIA symptoms.  There is no history of claudication or rest pain symptoms of the lower extremities.   The patient denies angina or shortness of breath.  CT scan dated 11/26/2022, shows an ascending TAA that measures 4.1 cm.  Duplex ultrasound of the aorta shows the maximal diameter is 1.89 cm, no aneurysm changes.   Current Meds  Medication Sig   baclofen (LIORESAL) 10 MG tablet Take 1 tablet (10 mg total) by mouth at bedtime as needed.   doxylamine, Sleep, (UNISOM) 25 MG tablet Take 25 mg by mouth at bedtime.   Estradiol Acetate (FEMRING) 0.05 MG/24HR RING PLACE 1 RING VAGINALLY EVERY 3 MONTHS. FOLLOW PACKAGE DIRECTIONS.   metoCLOPramide (REGLAN) 10 MG tablet TAKE 1 TABLET BY MOUTH DAILY AS NEEDED FOR NAUSEA   pantoprazole (PROTONIX) 20 MG tablet Take 1 tablet (20 mg total) by mouth daily. 30 minutes before food d/c 40 mg dose    Past Medical History:  Diagnosis Date   Arthritis    Barrett's esophagus    Carpal tunnel syndrome 01/08/2016   Right   COVID-19    beg. 2022   Esophagitis    Esophagitis    eosinophillic    GERD (gastroesophageal reflux disease)    Hyperlipidemia    Lesion of neck 02/16/2015   Mass of thigh 02/16/2015   Menopause 06/01/2018   Shingles    left eye x 2     Past Surgical History:  Procedure Laterality Date   CESAREAN SECTION  1988   COLONOSCOPY  07/2013   Dr. Vira Agar   mole removal  15 yaers ago    [place]     2007   [place]  2007,2012    Social History Social History   Tobacco Use   Smoking status: Never    Passive exposure: Never   Smokeless tobacco: Never  Vaping Use   Vaping Use: Never used  Substance Use Topics   Alcohol use: Yes    Comment: occasionally   Drug use: No    Family History Family History  Problem Relation Age of Onset   Cancer Mother        pancreatitic cancer    Hypothyroidism Mother    Goiter Mother    Depression Father    Dementia Father    Bipolar disorder Father    Hypothyroidism Father    Goiter Father    Depression Sister    Hyperlipidemia Sister    Hyperlipidemia Brother    Hypothyroidism Brother    Heart disease Paternal Grandfather        MI   Breast cancer Neg Hx     Allergies  Allergen Reactions   Corn Starch Nausea And Vomiting   Gluten Meal Nausea And Vomiting   Percocet [Oxycodone-Acetaminophen] Itching     REVIEW OF SYSTEMS (Negative unless checked)  Constitutional: '[]'$ Weight loss  '[]'$ Fever  '[]'$ Chills Cardiac: '[]'$ Chest pain   '[]'$ Chest pressure   '[]'$ Palpitations   '[]'$ Shortness of breath when laying flat   '[]'$ Shortness of breath with exertion. Vascular:  '[x]'$ Pain in legs with walking   '[]'$ Pain in legs at rest  '[]'$ History of DVT   '[]'$ Phlebitis   '[]'$ Swelling in legs   '[]'$ Varicose veins   '[]'$ Non-healing ulcers Pulmonary:   '[]'$ Uses home oxygen   '[]'$ Productive cough   '[]'$ Hemoptysis   '[]'$ Wheeze  '[]'$ COPD   '[]'$ Asthma Neurologic:  '[]'$ Dizziness   '[]'$ Seizures   '[]'$ History of stroke   '[]'$ History of TIA  '[]'$ Aphasia   '[]'$ Vissual changes   '[]'$ Weakness or numbness in arm   '[]'$ Weakness or numbness in leg Musculoskeletal:   '[]'$ Joint swelling   '[]'$ Joint pain   '[]'$ Low back pain Hematologic:  '[]'$ Easy bruising  '[]'$ Easy bleeding   '[]'$ Hypercoagulable state   '[]'$ Anemic Gastrointestinal:  '[]'$ Diarrhea   '[]'$ Vomiting  '[x]'$ Gastroesophageal reflux/heartburn   '[]'$ Difficulty swallowing. Genitourinary:  '[]'$ Chronic kidney disease   '[]'$ Difficult urination   '[]'$ Frequent urination   '[]'$ Blood in urine Skin:  '[]'$ Rashes   '[]'$ Ulcers  Psychological:  '[]'$ History of anxiety   '[]'$  History of major depression.  Physical Examination  Vitals:   11/27/22 1314  BP: 126/80  Pulse: 71  Weight: 175 lb (79.4 kg)  Height: '5\' 7"'$  (1.702 m)   Body mass index is 27.41 kg/m. Gen: WD/WN, NAD Head: [place]/AT, No temporalis wasting.  Ear/Nose/Throat: Hearing grossly intact, nares w/o erythema or drainage Eyes: PER, EOMI, sclera nonicteric.  Neck: Supple, no masses.  No bruit or JVD.  Pulmonary:  Good air movement, no audible wheezing, no use of accessory muscles.  Cardiac: RRR, normal S1, S2, no Murmurs. Vascular:  mild trophic changes, no open wounds Vessel Right Left  Radial Palpable Palpable  PT Not Palpable Not Palpable  DP Not Palpable Not Palpable  Gastrointestinal: soft, non-distended. No guarding/no peritoneal signs.  Musculoskeletal: M/S 5/5 throughout.  No visible deformity.  Neurologic: CN 2-12 intact. Pain and light touch intact in extremities.  Symmetrical.  Speech is fluent. Motor exam as listed above. Psychiatric: Judgment intact, Mood & affect appropriate for pt's clinical situation. Dermatologic: No rashes or ulcers noted.  No changes consistent with cellulitis.   CBC Lab Results  Component Value Date   WBC 4.5 09/15/2022   HGB 14.3 09/15/2022   HCT 42.5 09/15/2022   MCV 91.3 09/15/2022   PLT 159.0 09/15/2022    BMET    Component Value Date/Time   NA 142 09/15/2022 0831   NA 138 01/05/2014 0829   K 4.2 09/15/2022 0831   K 3.9 01/05/2014 0829   CL 103 09/15/2022 0831   CL 107 01/05/2014 0829   CO2 29 09/15/2022 0831   CO2 25 01/05/2014 0829   GLUCOSE 98 09/15/2022 0831   GLUCOSE 98 01/05/2014 0829   BUN 14 09/15/2022 0831   BUN 12 01/05/2014 0829   CREATININE 0.67 09/15/2022 0831   CREATININE 0.86 01/05/2014 0829   CALCIUM 9.3 09/15/2022 0831   CALCIUM 8.9 01/05/2014 0829   GFRNONAA >60 08/22/2017 0717   GFRNONAA >60 01/05/2014  0829   GFRAA >60 08/22/2017 0717   GFRAA >60 01/05/2014 0829   CrCl cannot be calculated (Patient's most recent lab result is older than the maximum 21 days allowed.).  COAG No results found for: "INR", "PROTIME"  Radiology No results found.   Assessment/Plan 1. Aneurysm of ascending aorta without rupture (HCC) Recommend:  No surgery or intervention is indicated at this time.  The patient has an asymptomatic thoracic aortic aneurysm that is less than 5.0 cm in maximal diameter.  I have discussed the natural history of thoracic aortic aneurysm and the small risk of rupture for aneurysm less than 6.5 cm in size.  However, as these small aneurysms tend to enlarge over time, continued surveillance with CT scan is mandatory.   I have also discussed optimizing medical management with hypertension and lipid control and the importance of abstinence from tobacco.  The patient is also encouraged to exercise a minimum of 30 minutes 4 times a week.   Should the patient develop new onset chest or back pain or signs of peripheral embolization they are instructed to seek medical attention immediately and to alert the physician providing care that they have an aneurysm in the chest.   The patient voices their understanding.  The patient will return as ordered with a CT scan of the chest  2. Gastroesophageal reflux disease, unspecified whether esophagitis present Continue PPI as already ordered, this medication has been reviewed and there are no changes at this time.  Avoidence of caffeine and alcohol  Moderate elevation of the head of the bed   3. Other hyperlipidemia Continue statin as ordered and reviewed, no changes at this time  4. Lumbar radiculopathy Continue NSAID medications as already ordered, these medications have been reviewed and there are no changes at this time.  Continued activity and therapy was stressed.    Hortencia Pilar, MD  11/27/2022 1:19 PM

## 2022-11-30 ENCOUNTER — Encounter (INDEPENDENT_AMBULATORY_CARE_PROVIDER_SITE_OTHER): Payer: Self-pay | Admitting: Vascular Surgery

## 2022-11-30 DIAGNOSIS — K219 Gastro-esophageal reflux disease without esophagitis: Secondary | ICD-10-CM | POA: Insufficient documentation

## 2022-12-05 ENCOUNTER — Other Ambulatory Visit: Payer: Self-pay

## 2022-12-08 ENCOUNTER — Other Ambulatory Visit: Payer: Self-pay

## 2022-12-09 ENCOUNTER — Other Ambulatory Visit: Payer: Self-pay | Admitting: Family Medicine

## 2022-12-09 ENCOUNTER — Other Ambulatory Visit: Payer: Self-pay

## 2022-12-09 DIAGNOSIS — M62830 Muscle spasm of back: Secondary | ICD-10-CM

## 2022-12-09 DIAGNOSIS — M5416 Radiculopathy, lumbar region: Secondary | ICD-10-CM

## 2022-12-10 ENCOUNTER — Other Ambulatory Visit: Payer: Self-pay

## 2022-12-10 MED FILL — Baclofen Tab 10 MG: ORAL | 90 days supply | Qty: 90 | Fill #0 | Status: AC

## 2022-12-16 ENCOUNTER — Other Ambulatory Visit: Payer: Self-pay

## 2022-12-29 ENCOUNTER — Encounter: Payer: Self-pay | Admitting: *Deleted

## 2022-12-29 NOTE — Telephone Encounter (Signed)
Order pended for your approval

## 2022-12-30 ENCOUNTER — Other Ambulatory Visit: Payer: Self-pay

## 2022-12-30 MED ORDER — ESTRADIOL 0.1 MG/GM VA CREA
1.0000 g | TOPICAL_CREAM | VAGINAL | 12 refills | Status: DC
  Filled 2022-12-30 – 2023-09-04 (×2): qty 30, 30d supply, fill #0
  Filled 2023-09-08: qty 42.5, 90d supply, fill #0
  Filled 2023-11-30: qty 42.5, 90d supply, fill #1

## 2023-01-12 ENCOUNTER — Other Ambulatory Visit: Payer: Self-pay

## 2023-01-15 ENCOUNTER — Other Ambulatory Visit: Payer: Self-pay

## 2023-01-21 DIAGNOSIS — K2270 Barrett's esophagus without dysplasia: Secondary | ICD-10-CM | POA: Diagnosis not present

## 2023-01-21 DIAGNOSIS — Z1211 Encounter for screening for malignant neoplasm of colon: Secondary | ICD-10-CM | POA: Diagnosis not present

## 2023-02-09 ENCOUNTER — Encounter: Payer: Self-pay | Admitting: Family Medicine

## 2023-03-07 ENCOUNTER — Other Ambulatory Visit: Payer: Self-pay

## 2023-03-07 MED FILL — Baclofen Tab 10 MG: ORAL | 90 days supply | Qty: 90 | Fill #1 | Status: AC

## 2023-03-08 ENCOUNTER — Other Ambulatory Visit: Payer: Self-pay

## 2023-03-09 ENCOUNTER — Other Ambulatory Visit: Payer: Self-pay

## 2023-03-10 ENCOUNTER — Other Ambulatory Visit: Payer: Self-pay | Admitting: Family Medicine

## 2023-03-10 ENCOUNTER — Other Ambulatory Visit: Payer: Self-pay

## 2023-03-10 DIAGNOSIS — K22719 Barrett's esophagus with dysplasia, unspecified: Secondary | ICD-10-CM

## 2023-03-11 MED FILL — Pantoprazole Sodium EC Tab 20 MG (Base Equiv): ORAL | 90 days supply | Qty: 90 | Fill #0 | Status: AC

## 2023-03-12 ENCOUNTER — Other Ambulatory Visit: Payer: Self-pay

## 2023-03-13 ENCOUNTER — Other Ambulatory Visit: Payer: Self-pay

## 2023-06-03 ENCOUNTER — Ambulatory Visit
Admission: RE | Admit: 2023-06-03 | Discharge: 2023-06-03 | Disposition: A | Discharge status: Home / Self Care | Payer: 59 | Attending: Internal Medicine | Admitting: Internal Medicine

## 2023-06-03 ENCOUNTER — Encounter
Admission: RE | Disposition: A | Discharge status: Home / Self Care | Payer: Self-pay | Source: Home / Self Care | Attending: Internal Medicine

## 2023-06-03 ENCOUNTER — Ambulatory Visit: Payer: 59 | Admitting: Anesthesiology

## 2023-06-03 DIAGNOSIS — K219 Gastro-esophageal reflux disease without esophagitis: Secondary | ICD-10-CM | POA: Insufficient documentation

## 2023-06-03 DIAGNOSIS — K643 Fourth degree hemorrhoids: Secondary | ICD-10-CM | POA: Insufficient documentation

## 2023-06-03 DIAGNOSIS — K644 Residual hemorrhoidal skin tags: Secondary | ICD-10-CM | POA: Diagnosis not present

## 2023-06-03 DIAGNOSIS — K449 Diaphragmatic hernia without obstruction or gangrene: Secondary | ICD-10-CM | POA: Insufficient documentation

## 2023-06-03 DIAGNOSIS — Z1211 Encounter for screening for malignant neoplasm of colon: Secondary | ICD-10-CM | POA: Insufficient documentation

## 2023-06-03 DIAGNOSIS — K5790 Diverticulosis of intestine, part unspecified, without perforation or abscess without bleeding: Secondary | ICD-10-CM | POA: Diagnosis not present

## 2023-06-03 DIAGNOSIS — Z79899 Other long term (current) drug therapy: Secondary | ICD-10-CM | POA: Diagnosis not present

## 2023-06-03 DIAGNOSIS — K5730 Diverticulosis of large intestine without perforation or abscess without bleeding: Secondary | ICD-10-CM | POA: Insufficient documentation

## 2023-06-03 DIAGNOSIS — K2270 Barrett's esophagus without dysplasia: Secondary | ICD-10-CM | POA: Insufficient documentation

## 2023-06-03 DIAGNOSIS — E785 Hyperlipidemia, unspecified: Secondary | ICD-10-CM | POA: Diagnosis not present

## 2023-06-03 DIAGNOSIS — K317 Polyp of stomach and duodenum: Secondary | ICD-10-CM | POA: Diagnosis not present

## 2023-06-03 DIAGNOSIS — K648 Other hemorrhoids: Secondary | ICD-10-CM | POA: Diagnosis not present

## 2023-06-03 HISTORY — PX: ESOPHAGOGASTRODUODENOSCOPY (EGD) WITH PROPOFOL: SHX5813

## 2023-06-03 HISTORY — PX: BIOPSY: SHX5522

## 2023-06-03 HISTORY — PX: COLONOSCOPY WITH PROPOFOL: SHX5780

## 2023-06-03 SURGERY — COLONOSCOPY WITH PROPOFOL
Anesthesia: General

## 2023-06-03 MED ORDER — PROPOFOL 10 MG/ML IV BOLUS
INTRAVENOUS | Status: DC | PRN
  Administered 2023-06-03: 50 mg via INTRAVENOUS
  Administered 2023-06-03 (×9): 20 mg via INTRAVENOUS

## 2023-06-03 MED ORDER — LIDOCAINE HCL (PF) 2 % IJ SOLN
INTRAMUSCULAR | Status: DC | PRN
  Administered 2023-06-03: 40 mg via INTRADERMAL

## 2023-06-03 MED ORDER — SODIUM CHLORIDE 0.9 % IV SOLN: INTRAVENOUS | Status: DC

## 2023-06-03 NOTE — Interval H&P Note (Signed)
History and Physical Interval Note:  06/03/2023 10:12 AM  Sarah Dennis  has presented today for surgery, with the diagnosis of  530.85 (ICD-9-CM) - K22.70 (ICD-10-CM) - Barrett's esophagus without dysplasia  V76.51 (ICD-9-CM) - Z12.11 (ICD-10-CM) - Screen for colon cancer.  The various methods of treatment have been discussed with the patient and family. After consideration of risks, benefits and other options for treatment, the patient has consented to  Procedure(s): COLONOSCOPY WITH PROPOFOL (N/A) ESOPHAGOGASTRODUODENOSCOPY (EGD) WITH PROPOFOL (N/A) as a surgical intervention.  The patient's history has been reviewed, patient examined, no change in status, stable for surgery.  I have reviewed the patient's chart and labs.  Questions were answered to the patient's satisfaction.     [place], [place]

## 2023-06-03 NOTE — Anesthesia Preprocedure Evaluation (Signed)
Anesthesia Evaluation  Patient identified by MRN, date of birth, ID band Patient awake    Reviewed: Allergy & Precautions, NPO status , Patient's Chart, lab work & pertinent test results  History of Anesthesia Complications Negative for: history of anesthetic complications  Airway Mallampati: III  TM Distance: >3 FB Neck ROM: full    Dental  (+) Chipped   Pulmonary neg pulmonary ROS, neg shortness of breath   Pulmonary exam normal        Cardiovascular Exercise Tolerance: Good (-) angina negative cardio ROS Normal cardiovascular exam     Neuro/Psych  Neuromuscular disease  negative psych ROS   GI/Hepatic Neg liver ROS,GERD  Controlled,,  Endo/Other  negative endocrine ROS    Renal/GU negative Renal ROS  negative genitourinary   Musculoskeletal   Abdominal   Peds  Hematology negative hematology ROS (+)   Anesthesia Other Findings Past Medical History: No date: Arthritis No date: Barrett's esophagus 01/08/2016: Carpal tunnel syndrome     Comment:  Right No date: COVID-19     Comment:  beg. 2022 No date: Esophagitis No date: Esophagitis     Comment:  eosinophillic  No date: GERD (gastroesophageal reflux disease) No date: Hyperlipidemia 02/16/2015: Lesion of neck 02/16/2015: Mass of thigh 06/01/2018: Menopause No date: Shingles     Comment:  left eye x 2   Past Surgical History: 1988: CESAREAN SECTION 07/2013: COLONOSCOPY     Comment:  Dr. Mechele Collin 15 yaers ago: mole removal No date: NOVASURE ABLATION     Comment:  2007 2000: TUBAL LIGATION 2007,2012: VEIN SURGERY  BMI    Body Mass Index: 25.97 kg/m      Reproductive/Obstetrics negative OB ROS                             Anesthesia Physical Anesthesia Plan  ASA: 2  Anesthesia Plan: General   Post-op Pain Management:    Induction: Intravenous  PONV Risk Score and Plan: Propofol infusion and TIVA  Airway  Management Planned: Natural Airway and Nasal Cannula  Additional Equipment:   Intra-op Plan:   Post-operative Plan:   Informed Consent: I have reviewed the patients History and Physical, chart, labs and discussed the procedure including the risks, benefits and alternatives for the proposed anesthesia with the patient or authorized representative who has indicated his/her understanding and acceptance.     Dental Advisory Given  Plan Discussed with: Anesthesiologist, CRNA and Surgeon  Anesthesia Plan Comments: (Patient consented for risks of anesthesia including but not limited to:  - adverse reactions to medications - risk of airway placement if required - damage to eyes, teeth, lips or other oral mucosa - nerve damage due to positioning  - sore throat or hoarseness - Damage to heart, brain, nerves, lungs, other parts of body or loss of life  Patient voiced understanding.)       Anesthesia Quick Evaluation

## 2023-06-03 NOTE — Op Note (Signed)
[facility] Gastroenterology Patient Name: Sarah Dennis Procedure Date: 06/03/2023 10:13 AM MRN: [id number] Account #: [id number] Date of Birth: [birth year] Admit Type: Outpatient Age: 62 Room: [facility] ENDO ROOM 2 Gender: Female Note Status: Finalized Instrument Name: Upper Endoscope [id number] Procedure:             Upper GI endoscopy Indications:           Surveillance for malignancy due to personal history of                         Barrett's esophagus Providers:             Boykin Nearing. Norma Fredrickson MD, MD Referring MD:          Dana Allan (Referring MD) Medicines:             Propofol per Anesthesia Complications:         No immediate complications. Estimated blood loss:                         Minimal. Procedure:             Pre-Anesthesia Assessment:                        - The risks and benefits of the procedure and the                         sedation options and risks were discussed with the                         patient. All questions were answered and informed                         consent was obtained.                        - Patient identification and proposed procedure were                         verified prior to the procedure by the nurse. The                         procedure was verified in the procedure room.                        - ASA Grade Assessment: III - A patient with severe                         systemic disease.                        - After reviewing the risks and benefits, the patient                         was deemed in satisfactory condition to undergo the                         procedure.                        After obtaining informed consent,  the endoscope was                         passed under direct vision. Throughout the procedure,                         the patient's blood pressure, pulse, and oxygen                         saturations were monitored continuously. The Endoscope                         was introduced  through the mouth, and advanced to the                         third part of duodenum. The upper GI endoscopy was                         accomplished without difficulty. The patient tolerated                         the procedure well. Findings:      There were esophageal mucosal changes secondary to established       short-segment Barrett's disease present in the distal esophagus. The       maximum longitudinal extent of these mucosal changes was 2 cm in length.       Mucosa was biopsied with a cold forceps for histology in 4 quadrants at       the gastroesophageal junction. One specimen bottle was sent to       pathology. Estimated blood loss was minimal.      The exam of the esophagus was otherwise normal.      A 1 cm hiatal hernia was present.      Multiple medium pedunculated and sessile polyps with no bleeding and no       stigmata of recent bleeding were found in the gastric body. Biopsies       were taken with a cold forceps for histology.      The exam of the stomach was otherwise normal.      The examined duodenum was normal. Impression:            - Esophageal mucosal changes secondary to established                         short-segment Barrett's disease. Biopsied.                        - 1 cm hiatal hernia.                        - Multiple gastric polyps. Biopsied.                        - Normal examined duodenum. Recommendation:        - Await pathology results.                        - Proceed with colonoscopy Procedure Code(s):     --- Professional ---  [address], Esophagogastroduodenoscopy, flexible,                         transoral; with biopsy, single or multiple Diagnosis Code(s):     --- Professional ---                        K31.7, Polyp of stomach and duodenum                        K44.9, Diaphragmatic hernia without obstruction or                         gangrene                        K22.70, Barrett's esophagus without dysplasia CPT  copyright 2022 American Medical Association. All rights reserved. The codes documented in this report are preliminary and upon coder review may  be revised to meet current compliance requirements. Stanton Kidney MD, MD 06/03/2023 10:37:36 AM This report has been signed electronically. Number of Addenda: 0 Note Initiated On: 06/03/2023 10:13 AM Estimated Blood Loss:  Estimated blood loss was minimal.      [facility]

## 2023-06-03 NOTE — Op Note (Signed)
[facility] Gastroenterology Patient Name: Sarah Dennis Procedure Date: 06/03/2023 10:12 AM MRN: [id number] Account #: [id number] Date of Birth: 1961-06-08 Admit Type: Outpatient Age: 62 Room: [facility] ENDO ROOM 2 Gender: Female Note Status: Finalized Instrument Name: Nelda Marseille [id number] Procedure:             Colonoscopy Indications:           Screening for colorectal malignant neoplasm Providers:             Royce Macadamia K. Norma Fredrickson MD, MD Referring MD:          Dana Allan (Referring MD) Medicines:             Propofol per Anesthesia Complications:         No immediate complications. Procedure:             Pre-Anesthesia Assessment:                        - The risks and benefits of the procedure and the                         sedation options and risks were discussed with the                         patient. All questions were answered and informed                         consent was obtained.                        - Patient identification and proposed procedure were                         verified prior to the procedure by the nurse. The                         procedure was verified in the procedure room.                        - ASA Grade Assessment: III - A patient with severe                         systemic disease.                        - After reviewing the risks and benefits, the patient                         was deemed in satisfactory condition to undergo the                         procedure.                        After obtaining informed consent, the colonoscope was                         passed under direct vision. Throughout the procedure,                         the patient's  blood pressure, pulse, and oxygen                         saturations were monitored continuously. The                         Colonoscope was introduced through the anus and                         advanced to the the cecum, identified by appendiceal                          orifice and ileocecal valve. The colonoscopy was                         performed without difficulty. The patient tolerated                         the procedure well. The quality of the bowel                         preparation was good. The ileocecal valve, appendiceal                         orifice, and rectum were photographed. Findings:      The perianal exam findings include internal hemorrhoids that do not       return to the anal canal, thus continuously prolapsed (Grade IV).      The digital rectal exam was normal. Pertinent negatives include normal       sphincter tone.      Non-bleeding internal hemorrhoids were found during retroflexion. The       hemorrhoids were Grade I (internal hemorrhoids that do not prolapse).      Many small-mouthed diverticula were found in the sigmoid colon. There       was no evidence of diverticular bleeding.      The exam was otherwise without abnormality. Impression:            - Internal hemorrhoids that do not return to the anal                         canal, thus continuously prolapsed (Grade IV) found on                         perianal exam.                        - Non-bleeding internal hemorrhoids.                        - Mild diverticulosis in the sigmoid colon. There was                         no evidence of diverticular bleeding.                        - The examination was otherwise normal.                        - No specimens collected. Recommendation:        -  Patient has a contact number available for                         emergencies. The signs and symptoms of potential                         delayed complications were discussed with the patient.                         Return to normal activities tomorrow. Written                         discharge instructions were provided to the patient.                        - Resume previous diet.                        - Continue present medications.                        - Repeat  EGD for Barrett's for surveillance, interval                         to be decided after pathology review.                        - Return to GI office in 1 year.                        - The findings and recommendations were discussed with                         the patient. Procedure Code(s):     --- Professional ---                        W1191, Colorectal cancer screening; colonoscopy on                         individual not meeting criteria for high risk Diagnosis Code(s):     --- Professional ---                        K57.30, Diverticulosis of large intestine without                         perforation or abscess without bleeding                        K64.3, Fourth degree hemorrhoids                        Z12.11, Encounter for screening for malignant neoplasm                         of colon CPT copyright 2022 American Medical Association. All rights reserved. The codes documented in this report are preliminary and upon coder review may  be revised to meet current compliance requirements. Stanton Kidney MD, MD 06/03/2023 10:54:09 AM This report has been signed electronically. Number of Addenda:  0 Note Initiated On: 06/03/2023 10:12 AM Scope Withdrawal Time: 0 hours 6 minutes 5 seconds  Total Procedure Duration: 0 hours 8 minutes 26 seconds  Estimated Blood Loss:  Estimated blood loss: none.      [facility]

## 2023-06-03 NOTE — H&P (Signed)
Outpatient short stay form Pre-procedure 06/03/2023 10:11 AM Hadi Dubin K. Norma Fredrickson, M.D.  Primary Physician: Dana Allan, M.D.  Reason for visit:  Barrett's esophagus, Colon cancer screening  History of present illness:  Ms. Osuch is a 62 year old female with history of GERD, hyperlipidemia, shingles, ascending thoracic aneurysm, returns for follow-up EGD for history of Barrett's esophagus. She is also due for repeat screening colonoscopy.  GERD/Hx of Barrett's: She remains on pantoprazole 20 mg daily. She takes pantoprazole 40 mg she has nosebleeds. She also gets nosebleeds with aspirin and NSAIDs. Her symptoms are well-controlled with the low-dose pantoprazole. She has placed herself on a strict diet with 47 pound weight loss over the last few years. She eats primarily meat products, rare vegetables, occasional salad, no carbs, gluten, sodas, processed foods or fruit. She eats meat products only b/w the hours of 3-5 daily. No snacking. She says she feels the best on this diet that she has in years. Normal bowel habits without bleeding.    Current Facility-Administered Medications:    0.9 %  sodium chloride infusion, , Intravenous, Continuous, Samyrah Bruster, Boykin Nearing, MD  Medications Prior to Admission  Medication Sig Dispense Refill Last Dose   baclofen (LIORESAL) 10 MG tablet Take 1 tablet (10 mg total) by mouth at bedtime as needed. 90 tablet 3 06/02/2023   conjugated estrogens (PREMARIN) vaginal cream Place 1 Applicatorful vaginally daily. 30 g 12 Past Week   doxylamine, Sleep, (UNISOM) 25 MG tablet Take 25 mg by mouth at bedtime.   06/02/2023   metoCLOPramide (REGLAN) 10 MG tablet TAKE 1 TABLET BY MOUTH DAILY AS NEEDED FOR NAUSEA 90 tablet 1 Past Week   pantoprazole (PROTONIX) 20 MG tablet Take 1 tablet (20 mg total) by mouth daily. 30 minutes before food 90 tablet 3 06/02/2023     Allergies  Allergen Reactions   Corn Starch Nausea And Vomiting   Gluten Meal Nausea And Vomiting   Percocet  [Oxycodone-Acetaminophen] Itching     Past Medical History:  Diagnosis Date   Arthritis    Barrett's esophagus    Carpal tunnel syndrome 01/08/2016   Right   COVID-19    beg. 2022   Esophagitis    Esophagitis    eosinophillic    GERD (gastroesophageal reflux disease)    Hyperlipidemia    Lesion of neck 02/16/2015   Mass of thigh 02/16/2015   Menopause 06/01/2018   Shingles    left eye x 2     Review of systems:  Otherwise negative.    Physical Exam  Gen: Alert, oriented. Appears stated age.  HEENT: [place]/AT. PERRLA. Lungs: CTA, no wheezes. CV: RR nl S1, S2. Abd: soft, benign, no masses. BS+ Ext: No edema. Pulses 2+    Planned procedures: Proceed with EGD and colonoscopy. The patient understands the nature of the planned procedure, indications, risks, alternatives and potential complications including but not limited to bleeding, infection, perforation, damage to internal organs and possible oversedation/side effects from anesthesia. The patient agrees and gives consent to proceed.  Please refer to procedure notes for findings, recommendations and patient disposition/instructions.     Huel Centola K. Norma Fredrickson, M.D. Gastroenterology 06/03/2023  10:11 AM

## 2023-06-03 NOTE — Anesthesia Postprocedure Evaluation (Signed)
Anesthesia Post Note  Patient: PATRIC VANPELT  Procedure(s) Performed: COLONOSCOPY WITH PROPOFOL ESOPHAGOGASTRODUODENOSCOPY (EGD) WITH PROPOFOL BIOPSY  Patient location during evaluation: Endoscopy Anesthesia Type: General Level of consciousness: awake and alert Pain management: pain level controlled Vital Signs Assessment: post-procedure vital signs reviewed and stable Respiratory status: spontaneous breathing, nonlabored ventilation, respiratory function stable and patient connected to nasal cannula oxygen Cardiovascular status: blood pressure returned to baseline and stable Postop Assessment: no apparent nausea or vomiting Anesthetic complications: no   No notable events documented.   Last Vitals:  Vitals:   06/03/23 1102 06/03/23 1112  BP: 103/66 115/81  Pulse: 62 (!) 52  Resp: 11 15  Temp:    SpO2: 97% 100%    Last Pain:  Vitals:   06/03/23 1112  TempSrc:   PainSc: 0-No pain                 Cleda Mccreedy Jermiyah Ricotta

## 2023-06-03 NOTE — Transfer of Care (Signed)
Immediate Anesthesia Transfer of Care Note  Patient: Sarah Dennis  Procedure(s) Performed: COLONOSCOPY WITH PROPOFOL ESOPHAGOGASTRODUODENOSCOPY (EGD) WITH PROPOFOL BIOPSY  Patient Location: PACU and Endoscopy Unit  Anesthesia Type:MAC  Level of Consciousness: drowsy  Airway & Oxygen Therapy: Patient Spontanous Breathing and Patient connected to nasal cannula oxygen  Post-op Assessment: Report given to RN and Post -op Vital signs reviewed and stable  Post vital signs: Reviewed and stable  Last Vitals:  Vitals Value Taken Time  BP    Temp    Pulse 65 06/03/23 1052  Resp 16 06/03/23 1052  SpO2 94 % 06/03/23 1052  Vitals shown include unvalidated device data.  Last Pain:  Vitals:   06/03/23 0933  TempSrc: Temporal  PainSc: 0-No pain         Complications: No notable events documented.

## 2023-06-04 ENCOUNTER — Encounter: Payer: Self-pay | Admitting: Internal Medicine

## 2023-06-09 MED FILL — Baclofen Tab 10 MG: ORAL | 90 days supply | Qty: 90 | Fill #2 | Status: AC

## 2023-09-04 ENCOUNTER — Other Ambulatory Visit: Payer: Self-pay

## 2023-09-07 ENCOUNTER — Other Ambulatory Visit: Payer: Self-pay | Admitting: Family Medicine

## 2023-09-07 DIAGNOSIS — N952 Postmenopausal atrophic vaginitis: Secondary | ICD-10-CM

## 2023-09-07 MED FILL — Baclofen Tab 10 MG: ORAL | 90 days supply | Qty: 90 | Fill #3 | Status: AC

## 2023-09-08 ENCOUNTER — Other Ambulatory Visit: Payer: Self-pay

## 2023-09-08 ENCOUNTER — Other Ambulatory Visit (HOSPITAL_COMMUNITY): Payer: Self-pay

## 2023-09-17 DIAGNOSIS — D0462 Carcinoma in situ of skin of left upper limb, including shoulder: Secondary | ICD-10-CM | POA: Diagnosis not present

## 2023-09-17 DIAGNOSIS — L821 Other seborrheic keratosis: Secondary | ICD-10-CM | POA: Diagnosis not present

## 2023-09-17 DIAGNOSIS — D492 Neoplasm of unspecified behavior of bone, soft tissue, and skin: Secondary | ICD-10-CM | POA: Diagnosis not present

## 2023-09-21 ENCOUNTER — Encounter: Payer: 59 | Admitting: Family Medicine

## 2023-09-22 ENCOUNTER — Encounter: Payer: Self-pay | Admitting: Family Medicine

## 2023-09-22 ENCOUNTER — Ambulatory Visit (INDEPENDENT_AMBULATORY_CARE_PROVIDER_SITE_OTHER): Payer: 59 | Admitting: Family Medicine

## 2023-09-22 ENCOUNTER — Other Ambulatory Visit: Payer: Self-pay

## 2023-09-22 VITALS — BP 114/80 | HR 77 | Temp 98.7°F | Resp 16 | Ht 68.25 in | Wt 174.0 lb

## 2023-09-22 DIAGNOSIS — Z1231 Encounter for screening mammogram for malignant neoplasm of breast: Secondary | ICD-10-CM | POA: Diagnosis not present

## 2023-09-22 DIAGNOSIS — K2270 Barrett's esophagus without dysplasia: Secondary | ICD-10-CM | POA: Diagnosis not present

## 2023-09-22 DIAGNOSIS — D0462 Carcinoma in situ of skin of left upper limb, including shoulder: Secondary | ICD-10-CM | POA: Diagnosis not present

## 2023-09-22 DIAGNOSIS — I7121 Aneurysm of the ascending aorta, without rupture: Secondary | ICD-10-CM

## 2023-09-22 DIAGNOSIS — E7849 Other hyperlipidemia: Secondary | ICD-10-CM

## 2023-09-22 DIAGNOSIS — Z0000 Encounter for general adult medical examination without abnormal findings: Secondary | ICD-10-CM | POA: Diagnosis not present

## 2023-09-22 DIAGNOSIS — E663 Overweight: Secondary | ICD-10-CM

## 2023-09-22 DIAGNOSIS — D0460 Carcinoma in situ of skin of unspecified upper limb, including shoulder: Secondary | ICD-10-CM | POA: Insufficient documentation

## 2023-09-22 LAB — CBC
HCT: 41.5 % (ref 36.0–46.0)
Hemoglobin: 13.6 g/dL (ref 12.0–15.0)
MCHC: 32.7 g/dL (ref 30.0–36.0)
MCV: 91.6 fL (ref 78.0–100.0)
Platelets: 160 10*3/uL (ref 150.0–400.0)
RBC: 4.53 Mil/uL (ref 3.87–5.11)
RDW: 15 % (ref 11.5–15.5)
WBC: 5.2 10*3/uL (ref 4.0–10.5)

## 2023-09-22 LAB — COMPREHENSIVE METABOLIC PANEL
ALT: 27 U/L (ref 0–35)
AST: 19 U/L (ref 0–37)
Albumin: 4.4 g/dL (ref 3.5–5.2)
Alkaline Phosphatase: 22 U/L — ABNORMAL LOW (ref 39–117)
BUN: 17 mg/dL (ref 6–23)
CO2: 29 meq/L (ref 19–32)
Calcium: 9.5 mg/dL (ref 8.4–10.5)
Chloride: 103 meq/L (ref 96–112)
Creatinine, Ser: 0.66 mg/dL (ref 0.40–1.20)
GFR: 95.52 mL/min (ref >60.00)
Glucose, Bld: 98 mg/dL (ref 70–99)
Potassium: 3.8 meq/L (ref 3.5–5.1)
Sodium: 140 meq/L (ref 135–145)
Total Bilirubin: 0.6 mg/dL (ref 0.2–1.2)
Total Protein: 6.9 g/dL (ref 6.0–8.3)

## 2023-09-22 LAB — LIPID PANEL
Cholesterol: 248 mg/dL — ABNORMAL HIGH (ref 0–200)
HDL: 59.7 mg/dL (ref >39.00)
LDL Cholesterol: 175 mg/dL — ABNORMAL HIGH (ref 0–99)
NonHDL: 188.49
Total CHOL/HDL Ratio: 4
Triglycerides: 66 mg/dL (ref 0.0–149.0)
VLDL: 13.2 mg/dL (ref 0.0–40.0)

## 2023-09-22 MED ORDER — PANTOPRAZOLE SODIUM 40 MG PO TBEC
40.0000 mg | DELAYED_RELEASE_TABLET | Freq: Every day | ORAL | 6 refills | Status: AC
  Filled 2023-09-22: qty 90, 90d supply, fill #0
  Filled 2023-12-19: qty 90, 90d supply, fill #1
  Filled 2024-03-18: qty 30, 30d supply, fill #2

## 2023-09-22 MED ORDER — METOCLOPRAMIDE HCL 10 MG PO TABS
10.0000 mg | ORAL_TABLET | Freq: Every day | ORAL | 1 refills | Status: AC | PRN
  Filled 2023-09-22: qty 90, 90d supply, fill #0

## 2023-09-22 NOTE — Progress Notes (Signed)
SUBJECTIVE:   Chief Complaint  Patient presents with   Annual Exam   HPI Presents to clinic for annual physical  Discussed the use of AI scribe software for clinical note transcription with the patient, who gave verbal consent to proceed.  History of Present Illness The patient, a 62 year old individual with a history of Barrett's esophagus and endovascular aneurysm, presented for an annual visit. The patient reported a recent diagnosis of squamous cell skin cancer, which was unexpected as she does not have a history of significant sun exposure or family history of skin cancer. The lesion, described as a small, scaly, pink spot, was biopsied and confirmed as squamous cell carcinoma.  The patient also discussed her Barrett's esophagus, for which she had been on Protonix. However, due to concerns about long-term use of the medication, the patient self-discontinued the Protonix and started using apple cider vinegar pills. She reported no reflux symptoms since the change, except when consuming certain foods like lemonade. The patient also mentioned occasional use of Reglan for reflux symptoms related to certain foods.  The patient's endovascular aneurysm is reportedly small and not causing any symptoms. The patient also reported occasional ankle swelling, which she attributed to inadequate hydration during her weekend work shifts.  The patient is due for a mammogram, which was discussed in the context of her recent skin cancer diagnosis. She also mentioned a recent calcium score of zero, despite a history of high cholesterol. The patient has not had any COVID-19 vaccinations but keeps her flu vaccination up to date.   PERTINENT PMH / PSH: As above  OBJECTIVE:  BP 114/80   Pulse 77   Temp 98.7 F (37.1 C)   Resp 16   Ht 5' 8.25" (1.734 m)   Wt 174 lb (78.9 kg)   SpO2 98%   BMI 26.26 kg/m    Physical Exam Vitals reviewed.  Constitutional:      General: She is not in acute  distress.    Appearance: She is not ill-appearing.  HENT:     Head: Normocephalic.     Right Ear: Tympanic membrane, ear canal and external ear normal.     Left Ear: Tympanic membrane, ear canal and external ear normal.     Nose: Nose normal.     Mouth/Throat:     Mouth: Mucous membranes are moist.  Eyes:     Extraocular Movements: Extraocular movements intact.     Conjunctiva/sclera: Conjunctivae normal.     Pupils: Pupils are equal, round, and reactive to light.  Neck:     Thyroid: No thyromegaly or thyroid tenderness.     Vascular: No carotid bruit.  Cardiovascular:     Rate and Rhythm: Normal rate and regular rhythm.     Pulses: Normal pulses.     Heart sounds: Normal heart sounds.  Pulmonary:     Effort: Pulmonary effort is normal.     Breath sounds: Normal breath sounds.  Abdominal:     General: Bowel sounds are normal. There is no distension.     Palpations: Abdomen is soft.     Tenderness: There is no abdominal tenderness. There is no right CVA tenderness, left CVA tenderness, guarding or rebound.  Musculoskeletal:        General: Normal range of motion.     Cervical back: Normal range of motion.     Right lower leg: No edema.     Left lower leg: No edema.  Lymphadenopathy:     Cervical: No cervical  adenopathy.  Skin:    Capillary Refill: Capillary refill takes less than 2 seconds.  Neurological:     General: No focal deficit present.     Mental Status: She is alert and oriented to person, place, and time. Mental status is at baseline.     Motor: No weakness.  Psychiatric:        Mood and Affect: Mood normal.        Behavior: Behavior normal.        Thought Content: Thought content normal.        Judgment: Judgment normal.        09/22/2023    8:35 AM 09/18/2022   11:07 AM 07/16/2022    8:55 AM 09/11/2021    9:30 AM 08/23/2020   11:43 AM  Depression screen PHQ 2/9  Decreased Interest 0 0 0 0 0  Down, Depressed, Hopeless 0 0 0 0 0  PHQ - 2 Score 0 0 0 0  0  Altered sleeping 0      Tired, decreased energy 0      Change in appetite 0      Feeling bad or failure about yourself  0      Trouble concentrating 0      Moving slowly or fidgety/restless 0      Suicidal thoughts 0      PHQ-9 Score 0      Difficult doing work/chores Not difficult at all          09/22/2023    8:35 AM 08/23/2020   11:43 AM  GAD 7 : Generalized Anxiety Score  Nervous, Anxious, on Edge 0 0  Control/stop worrying 0 0  Worry too much - different things 0 0  Trouble relaxing 0 0  Restless 0 0  Easily annoyed or irritable 0 0  Afraid - awful might happen 0 0  Total GAD 7 Score 0 0  Anxiety Difficulty Not difficult at all     ASSESSMENT/PLAN:  Annual physical exam Assessment & Plan: Healthy 63 year old female.  Mammogram due 07/2023 Colonoscopy up-to-date, due 2034 Last 2021 Pap NILM, HPV negative, due 08/30/2025,  DEXA scan at age 16 Tetanus up-to-date, due 2031 Annual flu vaccine up-to-date Shingles vaccination completed Pneumonia 20 vaccine at age 82 RSV vaccine at age 53 Hepatitis C/HIV screening completed Annual labs today Depression/GAD screening negative Hypertension screening completed Follow-up in 1 year for next annual     Other hyperlipidemia Assessment & Plan: Patient has a history of elevated cholesterol, but recent coronary calcium score was zero. -Order fasting lipid panel to assess current cholesterol levels.  Orders: -     Lipid panel  Overweight (BMI 25.0-29.9) -     Comprehensive metabolic panel -     CBC  Barrett's esophagus without dysplasia Assessment & Plan: Patient self-discontinued Protonix and has not experienced reflux symptoms. However, due to history of Barrett's esophagus, there is concern for recurrence if not on acid suppression. 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 -Obtain dermatology report for medical records.  Aneurysm of ascending aorta without rupture [facility]) Assessment & Plan: Patient is under cardiology follow-up for a small aneurysm, with next CT scan due in two years. -No immediate action required, continue cardiology follow-up as planned.    PDMP reviewed  Return if symptoms worsen or fail to improve, for PCP.  Dana Allan, MD

## 2023-09-22 NOTE — Assessment & Plan Note (Signed)
Recent diagnosis of skin cancer, scheduled for further excision and full body mole check. -Obtain dermatology report for medical records.

## 2023-09-22 NOTE — Patient Instructions (Addendum)
It was a pleasure meeting you today. Thank you for allowing me to take part in your health care.  Our goals for today as we discussed include:  Refills sent for requested medications  Please have Dermatology send results and notes to update your chart  Referral sent for Mammogram. Please call to schedule appointment. [facility] [address] [place], [place] [address] [phone]   This is a list of the screening recommended for you and due dates:  Health Maintenance  Topic Date Due   COVID-19 Vaccine (3 - 2023-24 season) 07/26/2023   Mammogram  08/06/2024   Pap with HPV screening  08/30/2025   DTaP/Tdap/Td vaccine (3 - Td or Tdap) 08/23/2030   Colon Cancer Screening  06/02/2033   Flu Shot  Completed   Hepatitis C Screening  Completed   HIV Screening  Completed   Zoster (Shingles) Vaccine  Completed   HPV Vaccine  Aged Out     Follow up as needed  If you have any questions or concerns, please do not hesitate to call the office at [phone].  I look forward to our next visit and until then take care and stay safe.  Regards,   Dana Allan, MD   [facility]

## 2023-09-22 NOTE — Assessment & Plan Note (Signed)
Patient has a history of elevated cholesterol, but recent coronary calcium score was zero. -Order fasting lipid panel to assess current cholesterol levels.

## 2023-09-22 NOTE — Assessment & Plan Note (Signed)
Patient self-discontinued Protonix and has not experienced reflux symptoms. However, due to history of Barrett's esophagus, there is concern for recurrence if not on acid suppression. -Review of GI note indicate continuation of PPI

## 2023-09-22 NOTE — Assessment & Plan Note (Signed)
Healthy 62 year old female.  Mammogram due 07/2023 Colonoscopy up-to-date, due 2034 Last 2021 Pap NILM, HPV negative, due 08/30/2025,  DEXA scan at age 50 Tetanus up-to-date, due 2031 Annual flu vaccine up-to-date Shingles vaccination completed Pneumonia 20 vaccine at age 86 RSV vaccine at age 62 Hepatitis C/HIV screening completed Annual labs today Depression/GAD screening negative Hypertension screening completed Follow-up in 1 year for next annual

## 2023-09-22 NOTE — Assessment & Plan Note (Signed)
Patient is under cardiology follow-up for a small aneurysm, with next CT scan due in two years. -No immediate action required, continue cardiology follow-up as planned.

## 2023-09-23 DIAGNOSIS — D0462 Carcinoma in situ of skin of left upper limb, including shoulder: Secondary | ICD-10-CM | POA: Diagnosis not present

## 2023-09-24 DIAGNOSIS — L821 Other seborrheic keratosis: Secondary | ICD-10-CM | POA: Diagnosis not present

## 2023-09-24 DIAGNOSIS — D225 Melanocytic nevi of trunk: Secondary | ICD-10-CM | POA: Diagnosis not present

## 2023-09-24 DIAGNOSIS — Z85828 Personal history of other malignant neoplasm of skin: Secondary | ICD-10-CM | POA: Diagnosis not present

## 2023-09-24 DIAGNOSIS — Z86007 Personal history of in-situ neoplasm of skin: Secondary | ICD-10-CM | POA: Diagnosis not present

## 2023-09-24 DIAGNOSIS — D1801 Hemangioma of skin and subcutaneous tissue: Secondary | ICD-10-CM | POA: Diagnosis not present

## 2023-09-24 DIAGNOSIS — Z7189 Other specified counseling: Secondary | ICD-10-CM | POA: Diagnosis not present

## 2023-09-24 DIAGNOSIS — D2271 Melanocytic nevi of right lower limb, including hip: Secondary | ICD-10-CM | POA: Diagnosis not present

## 2023-09-24 DIAGNOSIS — Z08 Encounter for follow-up examination after completed treatment for malignant neoplasm: Secondary | ICD-10-CM | POA: Diagnosis not present

## 2023-09-24 DIAGNOSIS — L814 Other melanin hyperpigmentation: Secondary | ICD-10-CM | POA: Diagnosis not present

## 2023-10-14 ENCOUNTER — Ambulatory Visit
Admission: RE | Admit: 2023-10-14 | Discharge: 2023-10-14 | Disposition: A | Discharge status: Home / Self Care | Payer: 59 | Source: Ambulatory Visit | Attending: Family Medicine | Admitting: Family Medicine

## 2023-10-14 DIAGNOSIS — Z1231 Encounter for screening mammogram for malignant neoplasm of breast: Secondary | ICD-10-CM | POA: Insufficient documentation

## 2023-11-30 ENCOUNTER — Other Ambulatory Visit: Payer: Self-pay

## 2023-12-04 ENCOUNTER — Other Ambulatory Visit: Payer: Self-pay | Admitting: Family Medicine

## 2023-12-04 ENCOUNTER — Other Ambulatory Visit: Payer: Self-pay

## 2023-12-04 DIAGNOSIS — M5416 Radiculopathy, lumbar region: Secondary | ICD-10-CM

## 2023-12-04 DIAGNOSIS — M62830 Muscle spasm of back: Secondary | ICD-10-CM

## 2023-12-04 MED FILL — Baclofen Tab 10 MG: ORAL | 90 days supply | Qty: 90 | Fill #0 | Status: AC

## 2023-12-29 DIAGNOSIS — K219 Gastro-esophageal reflux disease without esophagitis: Secondary | ICD-10-CM | POA: Diagnosis not present

## 2023-12-29 DIAGNOSIS — K2270 Barrett's esophagus without dysplasia: Secondary | ICD-10-CM | POA: Diagnosis not present

## 2023-12-30 ENCOUNTER — Other Ambulatory Visit: Payer: Self-pay

## 2023-12-30 MED ORDER — PANTOPRAZOLE SODIUM 40 MG PO TBEC
40.0000 mg | DELAYED_RELEASE_TABLET | Freq: Every day | ORAL | 6 refills | Status: AC
  Filled 2023-12-30 – 2024-04-17 (×2): qty 30, 30d supply, fill #0
  Filled 2024-05-17: qty 30, 30d supply, fill #1
  Filled 2024-06-16: qty 30, 30d supply, fill #2
  Filled 2024-07-15: qty 30, 30d supply, fill #3
  Filled 2024-08-14: qty 30, 30d supply, fill #4
  Filled 2024-09-13: qty 30, 30d supply, fill #5
  Filled 2024-10-11: qty 30, 30d supply, fill #6

## 2024-02-29 MED FILL — Baclofen Tab 10 MG: ORAL | 90 days supply | Qty: 90 | Fill #1 | Status: AC

## 2024-03-18 ENCOUNTER — Other Ambulatory Visit: Payer: Self-pay

## 2024-03-23 ENCOUNTER — Other Ambulatory Visit: Payer: Self-pay

## 2024-03-23 DIAGNOSIS — D225 Melanocytic nevi of trunk: Secondary | ICD-10-CM | POA: Diagnosis not present

## 2024-03-23 DIAGNOSIS — R600 Localized edema: Secondary | ICD-10-CM | POA: Diagnosis not present

## 2024-03-23 DIAGNOSIS — L814 Other melanin hyperpigmentation: Secondary | ICD-10-CM | POA: Diagnosis not present

## 2024-03-23 DIAGNOSIS — Z7189 Other specified counseling: Secondary | ICD-10-CM | POA: Diagnosis not present

## 2024-03-23 DIAGNOSIS — Z85828 Personal history of other malignant neoplasm of skin: Secondary | ICD-10-CM | POA: Diagnosis not present

## 2024-03-23 DIAGNOSIS — L821 Other seborrheic keratosis: Secondary | ICD-10-CM | POA: Diagnosis not present

## 2024-03-23 DIAGNOSIS — Z08 Encounter for follow-up examination after completed treatment for malignant neoplasm: Secondary | ICD-10-CM | POA: Diagnosis not present

## 2024-03-23 DIAGNOSIS — D2271 Melanocytic nevi of right lower limb, including hip: Secondary | ICD-10-CM | POA: Diagnosis not present

## 2024-03-23 MED ORDER — FLUOCINONIDE 0.05 % EX CREA
1.0000 | TOPICAL_CREAM | Freq: Two times a day (BID) | CUTANEOUS | 3 refills | Status: AC | PRN
  Filled 2024-03-23: qty 30, 15d supply, fill #0

## 2024-04-18 ENCOUNTER — Other Ambulatory Visit: Payer: Self-pay

## 2024-04-19 ENCOUNTER — Other Ambulatory Visit: Payer: Self-pay

## 2024-05-30 MED FILL — Baclofen Tab 10 MG: ORAL | 90 days supply | Qty: 90 | Fill #2 | Status: AC

## 2024-08-27 MED FILL — Baclofen Tab 10 MG: ORAL | 90 days supply | Qty: 90 | Fill #3 | Status: AC

## 2024-09-22 ENCOUNTER — Encounter: Payer: Self-pay | Admitting: Nurse Practitioner

## 2024-09-22 ENCOUNTER — Other Ambulatory Visit: Payer: Self-pay

## 2024-09-22 ENCOUNTER — Ambulatory Visit: Admitting: Nurse Practitioner

## 2024-09-22 VITALS — BP 122/74 | HR 72 | Temp 99.0°F | Ht 68.25 in | Wt 186.0 lb

## 2024-09-22 DIAGNOSIS — Z131 Encounter for screening for diabetes mellitus: Secondary | ICD-10-CM

## 2024-09-22 DIAGNOSIS — Z0000 Encounter for general adult medical examination without abnormal findings: Secondary | ICD-10-CM | POA: Diagnosis not present

## 2024-09-22 DIAGNOSIS — I712 Thoracic aortic aneurysm, without rupture, unspecified: Secondary | ICD-10-CM

## 2024-09-22 DIAGNOSIS — E7849 Other hyperlipidemia: Secondary | ICD-10-CM | POA: Diagnosis not present

## 2024-09-22 DIAGNOSIS — K2270 Barrett's esophagus without dysplasia: Secondary | ICD-10-CM

## 2024-09-22 DIAGNOSIS — N951 Menopausal and female climacteric states: Secondary | ICD-10-CM | POA: Diagnosis not present

## 2024-09-22 DIAGNOSIS — Z1231 Encounter for screening mammogram for malignant neoplasm of breast: Secondary | ICD-10-CM

## 2024-09-22 LAB — COMPREHENSIVE METABOLIC PANEL WITH GFR
ALT: 21 U/L (ref 0–35)
AST: 15 U/L (ref 0–37)
Albumin: 4.8 g/dL (ref 3.5–5.2)
Alkaline Phosphatase: 29 U/L — ABNORMAL LOW (ref 39–117)
BUN: 15 mg/dL (ref 6–23)
CO2: 29 meq/L (ref 19–32)
Calcium: 9.4 mg/dL (ref 8.4–10.5)
Chloride: 103 meq/L (ref 96–112)
Creatinine, Ser: 0.65 mg/dL (ref 0.40–1.20)
GFR: 95.19 mL/min (ref >60.00)
Glucose, Bld: 100 mg/dL — ABNORMAL HIGH (ref 70–99)
Potassium: 3.9 meq/L (ref 3.5–5.1)
Sodium: 142 meq/L (ref 135–145)
Total Bilirubin: 0.8 mg/dL (ref 0.2–1.2)
Total Protein: 7.1 g/dL (ref 6.0–8.3)

## 2024-09-22 LAB — CBC WITH DIFFERENTIAL/PLATELET
Basophils Absolute: 0 K/uL (ref 0.0–0.1)
Basophils Relative: 0.7 % (ref 0.0–3.0)
Eosinophils Absolute: 0.1 K/uL (ref 0.0–0.7)
Eosinophils Relative: 2.2 % (ref 0.0–5.0)
HCT: 41.5 % (ref 36.0–46.0)
Hemoglobin: 14.3 g/dL (ref 12.0–15.0)
Lymphocytes Relative: 33.1 % (ref 12.0–46.0)
Lymphs Abs: 1.6 K/uL (ref 0.7–4.0)
MCHC: 34.3 g/dL (ref 30.0–36.0)
MCV: 89.8 fl (ref 78.0–100.0)
Monocytes Absolute: 0.4 K/uL (ref 0.1–1.0)
Monocytes Relative: 8.3 % (ref 3.0–12.0)
Neutro Abs: 2.6 K/uL (ref 1.4–7.7)
Neutrophils Relative %: 55.7 % (ref 43.0–77.0)
Platelets: 163 K/uL (ref 150.0–400.0)
RBC: 4.62 Mil/uL (ref 3.87–5.11)
RDW: 15 % (ref 11.5–15.5)
WBC: 4.7 K/uL (ref 4.0–10.5)

## 2024-09-22 LAB — LIPID PANEL
Cholesterol: 277 mg/dL — ABNORMAL HIGH (ref 0–200)
HDL: 68 mg/dL (ref >39.00)
LDL Cholesterol: 193 mg/dL — ABNORMAL HIGH (ref 0–99)
NonHDL: 209.07
Total CHOL/HDL Ratio: 4
Triglycerides: 82 mg/dL (ref 0.0–149.0)
VLDL: 16.4 mg/dL (ref 0.0–40.0)

## 2024-09-22 LAB — HEMOGLOBIN A1C: Hgb A1c MFr Bld: 5.6 % (ref 4.6–6.5)

## 2024-09-22 LAB — TSH: TSH: 1.8 u[IU]/mL (ref 0.35–5.50)

## 2024-09-22 MED ORDER — ESTRADIOL ACETATE 0.05 MG/24HR VA RING
VAGINAL_RING | VAGINAL | 4 refills | Status: DC
  Filled 2024-09-22 – 2024-09-26 (×5): qty 1, 90d supply, fill #0
  Filled 2024-09-26: qty 1, 84d supply, fill #0
  Filled 2024-09-29: qty 1, 90d supply, fill #0
  Filled 2024-10-11 – 2024-10-13 (×3): qty 1, 84d supply, fill #0

## 2024-09-22 NOTE — Progress Notes (Signed)
 Established Patient Office Visit  Subjective:  Patient ID: Sarah Dennis, female    DOB: 10-17-1961  Age: 63 y.o. MRN: [id number]  CC:  Chief Complaint  Patient presents with   Annual Exam   Discussed the use of AI scribe software for clinical note transcription with the patient, who gave verbal consent to proceed.  History of Present Illness Sarah Dennis is a 63 year old female who presents for transfer of care and physical exam.  She experiences vaginal dryness and dyspareunia due to the ineffectiveness of a cream she was switched to from the E ring, as mandated by her insurance. She has used the cream for a year but finds it still leaves her very dry, particularly affecting intercourse. She wishes to return to using the E ring.  She manages GERD with Reglan  as needed, particularly when she inadvertently consumes gluten or cornstarch, which causes reflux and vomiting. She also takes Protonix  once daily and has no current reflux symptoms. Bowel movements are regular without constipation or diarrhea.  Her cholesterol levels were last checked and found to be elevated, with an LDL of 175 and total cholesterol of 248. Despite this, her cardiac CT score was zero.  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 She also sees a land monthly and works part-time in PACU on weekends.  She drinks alcohol several times a week, consuming four to five shots of vodka weekly. She does not use recreational drugs, smoke, or vape. She is sexually active and has had a tubal ligation and endometrial ablation in the past. She exercises daily using a vibration plate and squats, and maintains a healthy diet, having eliminated sugar and junk food for three years due to reflux.  No chest pain, shortness of breath, headaches, constipation, diarrhea, or burning with urination. Regular bowel movements and no reflux symptoms.  Health Maintenance  Topic  Date Due   Hepatitis B Vaccine (2 of 3 - 19+ 3-dose series) 11/22/2008   COVID-19 Vaccine (3 - Pfizer risk series) 11/19/2020   Breast Cancer Screening  10/13/2024   Flu Shot  02/21/2025*   Pneumococcal Vaccine for age over 71 (1 of 1 - PCV) 09/22/2025*   Pap with HPV screening  08/30/2025   DTaP/Tdap/Td vaccine (3 - Td or Tdap) 08/23/2030   Colon Cancer Screening  06/02/2033   Hepatitis C Screening  Completed   HIV Screening  Completed   Zoster (Shingles) Vaccine  Completed   HPV Vaccine  Aged Out   Meningitis B Vaccine  Aged Out  *Topic was postponed. The date shown is not the original due date.    Past Medical History:  Diagnosis Date   Arthritis    Barrett's esophagus    Carpal tunnel syndrome 01/08/2016   Right   COVID-19    beg. 2022   Esophagitis    Esophagitis    eosinophillic    GERD (gastroesophageal reflux disease)    Hyperlipidemia    Lesion of neck 02/16/2015   Mass of thigh 02/16/2015   Menopause 06/01/2018   Shingles    left eye x 2     Past Surgical History:  Procedure Laterality Date   BIOPSY  06/03/2023   Procedure: BIOPSY;  Surgeon: Aundria, Ladell POUR, MD;  Location: [facility] ENDOSCOPY;  Service: Gastroenterology;;   CESAREAN SECTION  1988   COLONOSCOPY  07/2013   Dr. Viktoria   COLONOSCOPY WITH PROPOFOL  N/A 06/03/2023   Procedure: COLONOSCOPY  WITH PROPOFOL ;  Surgeon: Toledo, Ladell POUR, MD;  Location: [facility] ENDOSCOPY;  Service: Gastroenterology;  Laterality: N/A;   ESOPHAGOGASTRODUODENOSCOPY (EGD) WITH PROPOFOL  N/A 06/03/2023   Procedure: ESOPHAGOGASTRODUODENOSCOPY (EGD) WITH PROPOFOL ;  Surgeon: Toledo, Ladell POUR, MD;  Location: [facility] ENDOSCOPY;  Service: Gastroenterology;  Laterality: N/A;   mole removal  15 yaers ago   NOVASURE ABLATION     2007   TUBAL LIGATION  2000   VEIN SURGERY  2007,2012    Family History  Problem Relation Age of Onset   Cancer Mother        pancreatitic cancer    Hypothyroidism Mother    Goiter Mother    Depression Father     Dementia Father    Bipolar disorder Father    Hypothyroidism Father    Goiter Father    Depression Sister    Hyperlipidemia Sister    Hyperlipidemia Brother    Hypothyroidism Brother    Heart disease Paternal Grandfather        MI   Breast cancer Neg Hx    Colon cancer Neg Hx     Social History   Socioeconomic History   Marital status: Married    Spouse name: Not on file   Number of children: 2   Years of education: Not on file   Highest education level: Associate degree: occupational, scientist, product/process development, or vocational program  Occupational History   Not on file  Tobacco Use   Smoking status: Never    Passive exposure: Never   Smokeless tobacco: Never  Vaping Use   Vaping status: Never Used  Substance and Sexual Activity   Alcohol use: Yes    Alcohol/week: 5.0 standard drinks of alcohol    Types: 5 Shots of liquor per week   Drug use: No   Sexual activity: Yes    Birth control/protection: Surgical    Comment: men  Other Topics Concern   Not on file  Social History Narrative   RN in PACU at [employer]   No guns    Wears selt belt    Safe in relationship    2 kids    Married    Social Drivers of Corporate Investment Banker Strain: Low Risk  (09/18/2023)   Overall Financial Resource Strain (CARDIA)    Difficulty of Paying Living Expenses: Not hard at all  Food Insecurity: No Food Insecurity (09/18/2023)   Hunger Vital Sign    Worried About Running Out of Food in the Last Year: Never true    Ran Out of Food in the Last Year: Never true  Transportation Needs: No Transportation Needs (09/18/2023)   PRAPARE - Administrator, Civil Service (Medical): No    Lack of Transportation (Non-Medical): No  Physical Activity: Insufficiently Active (09/18/2023)   Exercise Vital Sign    Days of Exercise per Week: 2 days    Minutes of Exercise per Session: 20 min  Stress: No Stress Concern Present (09/18/2023)   [employer] of Occupational Health -  Occupational Stress Questionnaire    Feeling of Stress : Only a little  Social Connections: Socially Integrated (09/18/2023)   Social Connection and Isolation Panel    Frequency of Communication with Friends and Family: More than three times a week    Frequency of Social Gatherings with Friends and Family: More than three times a week    Attends Religious Services: More than 4 times per year    Active Member of [employer] or Organizations: Yes  Attends Banker Meetings: More than 4 times per year    Marital Status: Married  Catering Manager Violence: Not on file     Outpatient Medications Prior to Visit  Medication Sig Dispense Refill   baclofen  (LIORESAL ) 10 MG tablet Take 1 tablet (10 mg total) by mouth at bedtime as needed. 90 tablet 3   doxylamine, Sleep, (UNISOM) 25 MG tablet Take 25 mg by mouth at bedtime.     estradiol  (ESTRACE ) 0.1 MG/GM vaginal cream Place 1 g vaginally 2 (two) times a week. 42.5 g 12   metoCLOPramide  (REGLAN ) 10 MG tablet Take 1 tablet (10 mg total) by mouth daily as needed for nausea 90 tablet 1   pantoprazole  (PROTONIX ) 40 MG tablet Take 1 tablet (40 mg total) by mouth daily. 30 tablet 6   fluocinonide  cream (LIDEX ) 0.05 % Apply 1 Application topically 2 (two) times daily as needed for flares of swelling. 30 g 3   pantoprazole  (PROTONIX ) 20 MG tablet Take 1 tablet (20 mg total) by mouth daily. 30 minutes before food (Patient not taking: Reported on 09/22/2023) 90 tablet 3   pantoprazole  (PROTONIX ) 40 MG tablet Take 1 tablet (40 mg total) by mouth once daily 30 tablet 6   No facility-administered medications prior to visit.    Allergies  Allergen Reactions   Corn Starch Nausea And Vomiting   Gluten Meal Nausea And Vomiting   Percocet [Oxycodone-Acetaminophen] Itching    ROS Review of Systems Negative unless indicated in HPI.    Objective:    Physical Exam Constitutional:      Appearance: Normal appearance.  HENT:     Right Ear:  Tympanic membrane normal.     Left Ear: Tympanic membrane normal.     Nose: Nose normal.     Mouth/Throat:     Mouth: Mucous membranes are moist.  Eyes:     Conjunctiva/sclera: Conjunctivae normal.     Pupils: Pupils are equal, round, and reactive to light.  Cardiovascular:     Rate and Rhythm: Normal rate and regular rhythm.     Pulses: Normal pulses.     Heart sounds: Normal heart sounds.  Pulmonary:     Effort: Pulmonary effort is normal.     Breath sounds: Normal breath sounds.  Abdominal:     General: Bowel sounds are normal.     Palpations: Abdomen is soft.  Musculoskeletal:        General: Normal range of motion.     Cervical back: Normal range of motion. No tenderness.     Right lower leg: No edema.     Left lower leg: No edema.  Skin:    General: Skin is warm.     Findings: No bruising.  Neurological:     General: No focal deficit present.     Mental Status: She is alert and oriented to person, place, and time. Mental status is at baseline.  Psychiatric:        Mood and Affect: Mood normal.        Behavior: Behavior normal.        Thought Content: Thought content normal.        Judgment: Judgment normal.     BP 122/74   Pulse 72   Temp 99 F (37.2 C)   Ht 5' 8.25 (1.734 m)   Wt 186 lb (84.4 kg)   SpO2 98%   BMI 28.07 kg/m  Wt Readings from Last 3 Encounters:  09/22/24 186 lb (84.4 kg)  09/22/23 174 lb (78.9 kg)  06/03/23 170 lb 12.8 oz (77.5 kg)     Health Maintenance  Topic Date Due   Hepatitis B Vaccines 19-59 Average Risk (2 of 3 - 19+ 3-dose series) 11/22/2008   COVID-19 Vaccine (3 - Pfizer risk series) 11/19/2020   Mammogram  10/13/2024   Influenza Vaccine  02/21/2025 (Originally 06/24/2024)   Pneumococcal Vaccine: 50+ Years (1 of 1 - PCV) 09/22/2025 (Originally 07/22/2013)   Cervical Cancer Screening (HPV/Pap Cotest)  08/30/2025   DTaP/Tdap/Td (3 - Td or Tdap) 08/23/2030   Colonoscopy  06/02/2033   Hepatitis C Screening  Completed   HIV  Screening  Completed   Zoster Vaccines- Shingrix  Completed   HPV VACCINES  Aged Out   Meningococcal B Vaccine  Aged Out       Topic Date Due   Hepatitis B Vaccines 19-59 Average Risk (2 of 3 - 19+ 3-dose series) 11/22/2008    Lab Results  Component Value Date   TSH 1.80 09/22/2024   Lab Results  Component Value Date   WBC 4.7 09/22/2024   HGB 14.3 09/22/2024   HCT 41.5 09/22/2024   MCV 89.8 09/22/2024   PLT 163.0 09/22/2024   Lab Results  Component Value Date   NA 142 09/22/2024   K 3.9 09/22/2024   CO2 29 09/22/2024   GLUCOSE 100 (H) 09/22/2024   BUN 15 09/22/2024   CREATININE 0.65 09/22/2024   BILITOT 0.8 09/22/2024   ALKPHOS 29 (L) 09/22/2024   AST 15 09/22/2024   ALT 21 09/22/2024   PROT 7.1 09/22/2024   ALBUMIN 4.8 09/22/2024   CALCIUM  9.4 09/22/2024   ANIONGAP 9 08/22/2017   GFR 95.19 09/22/2024   Lab Results  Component Value Date   CHOL 277 (H) 09/22/2024   Lab Results  Component Value Date   HDL 68.00 09/22/2024   Lab Results  Component Value Date   LDLCALC 193 (H) 09/22/2024   Lab Results  Component Value Date   TRIG 82.0 09/22/2024   Lab Results  Component Value Date   CHOLHDL 4 09/22/2024   Lab Results  Component Value Date   HGBA1C 5.6 09/22/2024      Assessment & Plan:   Assessment & Plan Annual physical exam Routine adult wellness visit conducted. Preventive screenings and vaccinations discussed.  Colonoscopy and Pap up-to-date. - Update chart with hepatitis B vaccine records. - Declined breast exam. Order mammogram. - Document refusal of pneumonia vaccine. - Perform fasting labs including A1c. Orders:   Lipid panel   CBC with Differential/Platelet   Comprehensive metabolic panel with GFR   TSH  Screening mammogram, encounter for  Orders:   MM 3D SCREENING MAMMOGRAM BILATERAL BREAST; Future  Screening for diabetes mellitus  Orders:   Hemoglobin A1c  Vaginal dryness, menopausal Vaginal dryness and dyspareunia  while using estrogen cream.  Has tried cream from last 1 year.  Cream and ineffective. Patient prefers to get back on E ring. - E ring Prescription sent to the pharmacy.     Barrett's esophagus without dysplasia GERD managed with daily Protonix  and as-needed Reglan .     Other hyperlipidemia Last LDL 175, total cholesterol 248. Calcium  score zero, statins not initiated. Lifestyle modifications include healthy diet     Thoracic aortic aneurysm without rupture, unspecified part Aneurysm followed by Dr. Cordella. Last evaluation January 2024, follow-up January 2026.      Follow-up: No follow-ups on file.   Nakyiah Kuck, NP

## 2024-09-22 NOTE — Patient Instructions (Signed)
 YOUR MAMMOGRAM IS DUE, PLEASE CALL AND GET THIS SCHEDULED! [facility] - call [phone]

## 2024-09-22 NOTE — Assessment & Plan Note (Addendum)
 Routine adult wellness visit conducted. Preventive screenings and vaccinations discussed.  Colonoscopy and Pap up-to-date. - Update chart with hepatitis B vaccine records. - Declined breast exam. Order mammogram. - Document refusal of pneumonia vaccine. - Perform fasting labs including A1c. Orders:   Lipid panel   CBC with Differential/Platelet   Comprehensive metabolic panel with GFR   TSH

## 2024-09-23 ENCOUNTER — Other Ambulatory Visit: Payer: Self-pay

## 2024-09-23 DIAGNOSIS — Z08 Encounter for follow-up examination after completed treatment for malignant neoplasm: Secondary | ICD-10-CM | POA: Diagnosis not present

## 2024-09-23 DIAGNOSIS — Z86007 Personal history of in-situ neoplasm of skin: Secondary | ICD-10-CM | POA: Diagnosis not present

## 2024-09-23 DIAGNOSIS — D1801 Hemangioma of skin and subcutaneous tissue: Secondary | ICD-10-CM | POA: Diagnosis not present

## 2024-09-23 DIAGNOSIS — L814 Other melanin hyperpigmentation: Secondary | ICD-10-CM | POA: Diagnosis not present

## 2024-09-23 DIAGNOSIS — L821 Other seborrheic keratosis: Secondary | ICD-10-CM | POA: Diagnosis not present

## 2024-09-24 ENCOUNTER — Ambulatory Visit: Payer: Self-pay | Admitting: Nurse Practitioner

## 2024-09-24 ENCOUNTER — Encounter: Payer: Self-pay | Admitting: Nurse Practitioner

## 2024-09-24 NOTE — Assessment & Plan Note (Addendum)
 Vaginal dryness and dyspareunia while using estrogen cream.  Has tried cream from last 1 year.  Cream and ineffective. Patient prefers to get back on E ring. - E ring Prescription sent to the pharmacy.

## 2024-09-24 NOTE — Assessment & Plan Note (Addendum)
 Last LDL 175, total cholesterol 248. Calcium  score zero, statins not initiated. Lifestyle modifications include healthy diet

## 2024-09-24 NOTE — Assessment & Plan Note (Addendum)
 GERD managed with daily Protonix  and as-needed Reglan .

## 2024-09-24 NOTE — Assessment & Plan Note (Addendum)
 Aneurysm followed by Dr. Cordella. Last evaluation January 2024, follow-up January 2026.

## 2024-09-26 ENCOUNTER — Other Ambulatory Visit: Payer: Self-pay

## 2024-09-26 ENCOUNTER — Encounter: Payer: Self-pay | Admitting: Nurse Practitioner

## 2024-09-26 ENCOUNTER — Other Ambulatory Visit (HOSPITAL_COMMUNITY): Payer: Self-pay

## 2024-09-27 ENCOUNTER — Other Ambulatory Visit: Payer: Self-pay

## 2024-09-27 ENCOUNTER — Telehealth: Payer: Self-pay | Admitting: Nurse Practitioner

## 2024-09-27 NOTE — Telephone Encounter (Signed)
 Copied from CRM [id number]. Topic: General - Call Back - No Documentation >> Sep 27, 2024  9:23 AM Rea BROCKS wrote: Reason for CRM: Patient wants NP Vincente to know that starting in January her insurance/pharmacy with [facility] are changing to where they will have to have medication delivered to the house and that medications will now have to be a 90 day supply starting in January. Patients can no longer come to the pharmacy to pick up, everything is mail order.    [phone] (M)

## 2024-09-27 NOTE — Telephone Encounter (Signed)
 Noted

## 2024-09-29 ENCOUNTER — Other Ambulatory Visit (HOSPITAL_COMMUNITY): Payer: Self-pay

## 2024-09-29 ENCOUNTER — Other Ambulatory Visit: Payer: Self-pay

## 2024-09-29 ENCOUNTER — Telehealth: Payer: Self-pay

## 2024-09-29 NOTE — Telephone Encounter (Signed)
 PA request has been Started. New Encounter has been or will be created for follow up. For additional info see Pharmacy Prior Auth telephone encounter from 09/29/2024.

## 2024-09-29 NOTE — Telephone Encounter (Signed)
 Pharmacy Patient Advocate Encounter   Received notification from Physician's Office that prior authorization for Femring  0.05MG /24HR rings is required/requested.   Insurance verification completed.   The patient is insured through [facility].   Per test claim: PA required; PA submitted to above mentioned insurance via Latent Key/confirmation #/EOC [id number] Status is pending

## 2024-10-05 ENCOUNTER — Other Ambulatory Visit (HOSPITAL_COMMUNITY): Payer: Self-pay

## 2024-10-06 NOTE — Telephone Encounter (Signed)
 I have not heard back from [employer] as of yet. I can call them and see what the delay is.

## 2024-10-11 ENCOUNTER — Other Ambulatory Visit: Payer: Self-pay

## 2024-10-11 NOTE — Telephone Encounter (Signed)
 Additional information has been requested from the patient's insurance in order to proceed with the prior authorization request. Requested information has been sent, or form has been filled out and faxed back to [phone]

## 2024-10-11 NOTE — Telephone Encounter (Deleted)
 Prior Authorization form/request asks a question that requires your assistance. Please see the question below and advise accordingly. The PA will not be submitted until the necessary information is received.

## 2024-10-12 ENCOUNTER — Other Ambulatory Visit (HOSPITAL_COMMUNITY): Payer: Self-pay

## 2024-10-12 ENCOUNTER — Other Ambulatory Visit: Payer: Self-pay

## 2024-10-12 NOTE — Telephone Encounter (Signed)
 Noted

## 2024-10-12 NOTE — Telephone Encounter (Signed)
 I just called insurance company to check status of the prior auth, and I was told to check back this afternoon. According to rep it is still in review. I include cream in original prior auth, not sure what additional information they would need.

## 2024-10-12 NOTE — Telephone Encounter (Signed)
 Patient is aware of the approved PA for the femring .

## 2024-10-12 NOTE — Telephone Encounter (Signed)
 Adding note here from my encounter: I just called insurance company to check status of the prior auth, and I was told to check back this afternoon. According to rep it is still in review. I included cream in original prior auth, not sure what additional information they would need.

## 2024-10-12 NOTE — Telephone Encounter (Signed)
 Premarin  vaginal cream is in the Patient's chart from 7/10/245

## 2024-10-12 NOTE — Telephone Encounter (Signed)
 Pharmacy Patient Advocate Encounter  Received notification from [facility] that Prior Authorization for Femring  0.05MG /24HR rings  has been APPROVED from 10/12/24 to 10/11/25. Ran test claim, Copay is $300. This test claim was processed through [facility]- copay amounts may vary at other pharmacies due to pharmacy/plan contracts, or as the patient moves through the different stages of their insurance plan.   PA #/Case ID/Reference #: [id number]

## 2024-10-13 ENCOUNTER — Other Ambulatory Visit: Payer: Self-pay

## 2024-10-13 ENCOUNTER — Telehealth: Payer: Self-pay

## 2024-10-13 MED ORDER — ESTRADIOL ACETATE 0.05 MG/24HR VA RING: VAGINAL_RING | VAGINAL | 4 refills | Status: DC

## 2024-10-13 NOTE — Telephone Encounter (Signed)
 Patient is aware of the paper prescription.

## 2024-10-13 NOTE — Telephone Encounter (Signed)
 Okay to provide paper prescription.

## 2024-10-13 NOTE — Telephone Encounter (Signed)
Patient is aware and will pick up 

## 2024-10-13 NOTE — Telephone Encounter (Unsigned)
 Copied from CRM #[id number]. Topic: Clinical - Medication Question >> Oct 13, 2024 12:11 PM Sarah Dennis wrote: Reason for CRM: Patient is wanting to know if there is any way she can have a paper RX for Estradiol  Acetate 0.05 MG/24HR RING. States it is too expensive to pick up at her normal pharmacy and she has a coupon but has to have a paper RX - please advise # [phone]

## 2024-10-13 NOTE — Telephone Encounter (Signed)
 Patient has a coupon and a paper prescription.

## 2024-10-13 NOTE — Telephone Encounter (Signed)
 Printed prescription and placed on Charan's desk for a signature.

## 2024-10-13 NOTE — Telephone Encounter (Signed)
 Signed. Please inform pt to pick it up.

## 2024-10-17 ENCOUNTER — Other Ambulatory Visit: Payer: Self-pay

## 2024-10-17 NOTE — Telephone Encounter (Signed)
Prescription has been picked up.

## 2024-10-22 ENCOUNTER — Encounter: Payer: Self-pay | Admitting: Nurse Practitioner

## 2024-10-24 ENCOUNTER — Other Ambulatory Visit: Payer: Self-pay | Admitting: Nurse Practitioner

## 2024-10-24 DIAGNOSIS — N951 Menopausal and female climacteric states: Secondary | ICD-10-CM

## 2024-10-31 ENCOUNTER — Other Ambulatory Visit: Payer: Self-pay

## 2024-10-31 ENCOUNTER — Encounter: Payer: Self-pay | Admitting: Obstetrics & Gynecology

## 2024-10-31 ENCOUNTER — Ambulatory Visit: Admitting: Obstetrics & Gynecology

## 2024-10-31 VITALS — BP 127/83 | HR 70 | Ht 68.0 in | Wt 191.0 lb

## 2024-10-31 DIAGNOSIS — N941 Unspecified dyspareunia: Secondary | ICD-10-CM

## 2024-10-31 MED ORDER — ESTRING 2 MG VA RING
1.0000 | VAGINAL_RING | VAGINAL | 4 refills | Status: AC
  Filled 2024-10-31: qty 1, 90d supply, fill #0

## 2024-10-31 NOTE — Progress Notes (Signed)
    GYNECOLOGY PROGRESS NOTE  Subjective:    Patient ID: Sarah Dennis, female    DOB: [birth year], 63 y.o.   MRN: [id number]  HPI  Patient is a 63 y.o. married G2P2002 here as a new patient. She saw Dr Connell once for an annual exam in 2021. She is here to discuss dyspareunia. She used Estring  for a few years in the past but then her insurance didn't cover it so she tried estrace  cream. This didn't work, so she is now using  Femring  (no progestin) for the last week. She reports that she is able to get lubricated with sex but still has pain with sex, sometimes see blood afterwards. She has tried lubricants in the past but not recently.   The following portions of the patient's history were reviewed and updated as appropriate: allergies, current medications, past family history, past medical history, past social history, past surgical history, and problem list.  Review of Systems Pertinent items are noted in HPI.  Pap was normal in 2021  Objective:   Blood pressure (!) 155/100, pulse 70, height 5' 8 (1.727 m), weight 191 lb (86.6 kg). Body mass index is 29.04 kg/m. Well nourished, well hydrated White female, no apparent distress She is ambulating and conversing normally. EG- well estrogenized, minimal atrophy Bimanual exam- normal size and shape, anteverted uterus, normal adnexal exam   Assessment:   Dyspareunia Unopposed estrogen in the form of Femring . I strongly advised that she stop this.  I will prescribe Estring  and also rec coconut oil.   Plan:   Reassurance given

## 2024-11-02 ENCOUNTER — Other Ambulatory Visit: Payer: Self-pay

## 2024-11-04 ENCOUNTER — Other Ambulatory Visit: Payer: Self-pay

## 2024-11-07 ENCOUNTER — Other Ambulatory Visit: Payer: Self-pay

## 2024-11-08 ENCOUNTER — Other Ambulatory Visit: Payer: Self-pay

## 2024-11-08 ENCOUNTER — Other Ambulatory Visit: Payer: Self-pay | Admitting: Nurse Practitioner

## 2024-11-08 MED FILL — Pantoprazole Sodium EC Tab 40 MG (Base Equiv): ORAL | 30 days supply | Qty: 30 | Fill #0 | Status: AC

## 2024-11-09 ENCOUNTER — Encounter: Payer: Self-pay | Admitting: Nurse Practitioner

## 2024-11-09 ENCOUNTER — Other Ambulatory Visit: Payer: Self-pay

## 2024-11-10 ENCOUNTER — Other Ambulatory Visit: Payer: Self-pay

## 2024-11-10 ENCOUNTER — Other Ambulatory Visit: Payer: Self-pay | Admitting: Family

## 2024-11-10 DIAGNOSIS — M5416 Radiculopathy, lumbar region: Secondary | ICD-10-CM

## 2024-11-10 DIAGNOSIS — K2270 Barrett's esophagus without dysplasia: Secondary | ICD-10-CM

## 2024-11-10 DIAGNOSIS — M62830 Muscle spasm of back: Secondary | ICD-10-CM

## 2024-11-10 MED ORDER — ESTRING 2 MG VA RING
1.0000 | VAGINAL_RING | VAGINAL | 1 refills | Status: AC
  Filled 2024-11-10: qty 1, 90d supply, fill #0

## 2024-11-10 MED ORDER — PANTOPRAZOLE SODIUM 40 MG PO TBEC
40.0000 mg | DELAYED_RELEASE_TABLET | Freq: Every day | ORAL | 1 refills | Status: AC
  Filled 2024-11-10 – 2024-12-09 (×2): qty 90, 90d supply, fill #0

## 2024-11-10 MED FILL — Metoclopramide HCl Tab 10 MG (Base Equivalent): 10.0000 mg | ORAL | 90 days supply | Qty: 90 | Fill #0 | Status: AC

## 2024-11-10 MED FILL — Baclofen Tab 10 MG: 10.0000 mg | ORAL | 90 days supply | Qty: 90 | Fill #0 | Status: AC

## 2024-11-11 ENCOUNTER — Other Ambulatory Visit: Payer: Self-pay

## 2024-11-23 ENCOUNTER — Other Ambulatory Visit: Payer: Self-pay

## 2024-12-09 ENCOUNTER — Other Ambulatory Visit: Payer: Self-pay
